# Patient Record
Sex: Male | Born: 1965 | Race: White | Hispanic: No | Marital: Married | Smoking: Never smoker
Health system: Southern US, Community
[De-identification: ages and names within clinical notes are randomized; demographics above are authoritative.]

## PROBLEM LIST (undated history)

## (undated) DIAGNOSIS — Z8614 Personal history of Methicillin resistant Staphylococcus aureus infection: Secondary | ICD-10-CM

## (undated) DIAGNOSIS — Z9889 Other specified postprocedural states: Secondary | ICD-10-CM

## (undated) DIAGNOSIS — J189 Pneumonia, unspecified organism: Secondary | ICD-10-CM

## (undated) DIAGNOSIS — R112 Nausea with vomiting, unspecified: Secondary | ICD-10-CM

## (undated) DIAGNOSIS — Z87442 Personal history of urinary calculi: Secondary | ICD-10-CM

## (undated) HISTORY — PX: NASAL SEPTUM SURGERY: SHX37

## (undated) HISTORY — PX: HERNIA REPAIR: SHX51

## (undated) HISTORY — PX: HAND SURGERY: SHX662

## (undated) HISTORY — PX: KNEE SURGERY: SHX244

## (undated) HISTORY — PX: VASECTOMY: SHX75

---
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
## 2005-05-19 ENCOUNTER — Emergency Department (HOSPITAL_COMMUNITY): Admission: EM | Admit: 2005-05-19 | Discharge: 2005-05-19 | Payer: Self-pay | Admitting: Family Medicine

## 2010-09-20 ENCOUNTER — Ambulatory Visit
Admission: RE | Admit: 2010-09-20 | Discharge: 2010-09-20 | Payer: Self-pay | Source: Home / Self Care | Attending: Orthopedic Surgery | Admitting: Orthopedic Surgery

## 2010-12-16 LAB — POCT HEMOGLOBIN-HEMACUE: Hemoglobin: 16.4 g/dL (ref 13.0–17.0)

## 2013-08-24 ENCOUNTER — Other Ambulatory Visit: Payer: Self-pay | Admitting: Orthopedic Surgery

## 2013-08-26 ENCOUNTER — Encounter (HOSPITAL_COMMUNITY): Payer: Self-pay | Admitting: Pharmacy Technician

## 2013-08-29 ENCOUNTER — Encounter (HOSPITAL_COMMUNITY): Payer: Self-pay

## 2013-08-29 ENCOUNTER — Encounter (HOSPITAL_COMMUNITY)
Admission: RE | Admit: 2013-08-29 | Discharge: 2013-08-29 | Disposition: A | Discharge status: Home / Self Care | Payer: BC Managed Care – PPO | Source: Ambulatory Visit | Attending: Orthopedic Surgery | Admitting: Orthopedic Surgery

## 2013-08-29 ENCOUNTER — Ambulatory Visit (HOSPITAL_COMMUNITY)
Admission: RE | Admit: 2013-08-29 | Discharge: 2013-08-29 | Disposition: A | Discharge status: Home / Self Care | Payer: BC Managed Care – PPO | Source: Ambulatory Visit | Attending: Surgical | Admitting: Surgical

## 2013-08-29 DIAGNOSIS — R29898 Other symptoms and signs involving the musculoskeletal system: Secondary | ICD-10-CM | POA: Insufficient documentation

## 2013-08-29 DIAGNOSIS — Z01812 Encounter for preprocedural laboratory examination: Secondary | ICD-10-CM | POA: Insufficient documentation

## 2013-08-29 DIAGNOSIS — Z01810 Encounter for preprocedural cardiovascular examination: Secondary | ICD-10-CM | POA: Insufficient documentation

## 2013-08-29 DIAGNOSIS — Z8614 Personal history of Methicillin resistant Staphylococcus aureus infection: Secondary | ICD-10-CM | POA: Insufficient documentation

## 2013-08-29 DIAGNOSIS — M48062 Spinal stenosis, lumbar region with neurogenic claudication: Secondary | ICD-10-CM | POA: Insufficient documentation

## 2013-08-29 DIAGNOSIS — M5126 Other intervertebral disc displacement, lumbar region: Principal | ICD-10-CM | POA: Insufficient documentation

## 2013-08-29 DIAGNOSIS — M5430 Sciatica, unspecified side: Secondary | ICD-10-CM | POA: Insufficient documentation

## 2013-08-29 DIAGNOSIS — Z01818 Encounter for other preprocedural examination: Secondary | ICD-10-CM | POA: Insufficient documentation

## 2013-08-29 DIAGNOSIS — M47817 Spondylosis without myelopathy or radiculopathy, lumbosacral region: Secondary | ICD-10-CM | POA: Insufficient documentation

## 2013-08-29 HISTORY — DX: Personal history of Methicillin resistant Staphylococcus aureus infection: Z86.14

## 2013-08-29 HISTORY — DX: Pneumonia, unspecified organism: J18.9

## 2013-08-29 HISTORY — DX: Other specified postprocedural states: R11.2

## 2013-08-29 HISTORY — DX: Nausea with vomiting, unspecified: Z98.890

## 2013-08-29 LAB — COMPREHENSIVE METABOLIC PANEL
ALT: 45 U/L (ref 0–53)
AST: 24 U/L (ref 0–37)
Albumin: 4.2 g/dL (ref 3.5–5.2)
Alkaline Phosphatase: 52 U/L (ref 39–117)
BUN: 21 mg/dL (ref 6–23)
CO2: 24 mEq/L (ref 19–32)
Calcium: 9 mg/dL (ref 8.4–10.5)
Chloride: 101 mEq/L (ref 96–112)
Creatinine, Ser: 1.11 mg/dL (ref 0.50–1.35)
GFR calc Af Amer: >90 mL/min (ref >90)
GFR calc non Af Amer: 79 mL/min — ABNORMAL LOW (ref >90)
Glucose, Bld: 99 mg/dL (ref 70–99)
Potassium: 3.8 mEq/L (ref 3.5–5.1)
Sodium: 136 mEq/L (ref 135–145)
Total Bilirubin: 0.6 mg/dL (ref 0.3–1.2)
Total Protein: 6.8 g/dL (ref 6.0–8.3)

## 2013-08-29 LAB — URINALYSIS, ROUTINE W REFLEX MICROSCOPIC
Bilirubin Urine: NEGATIVE
Glucose, UA: NEGATIVE mg/dL
Hgb urine dipstick: NEGATIVE
Ketones, ur: NEGATIVE mg/dL
Leukocytes, UA: NEGATIVE
Nitrite: NEGATIVE
Protein, ur: NEGATIVE mg/dL
Specific Gravity, Urine: 1.022 (ref 1.005–1.030)
Urobilinogen, UA: 0.2 mg/dL (ref 0.0–1.0)
pH: 7 (ref 5.0–8.0)

## 2013-08-29 LAB — CBC
HCT: 45.5 % (ref 39.0–52.0)
RBC: 5 MIL/uL (ref 4.22–5.81)
RDW: 12.1 % (ref 11.5–15.5)
WBC: 7.7 10*3/uL (ref 4.0–10.5)

## 2013-08-29 LAB — PROTIME-INR
INR: 1.08 (ref 0.00–1.49)
Prothrombin Time: 13.8 seconds (ref 11.6–15.2)

## 2013-08-29 LAB — SURGICAL PCR SCREEN: Staphylococcus aureus: POSITIVE — AB

## 2013-08-29 LAB — APTT: aPTT: 29 seconds (ref 24–37)

## 2013-08-29 NOTE — Patient Instructions (Addendum)
22 Willie Davis  08/29/2013   Your procedure is scheduled on:  Tuesday  11/25  Report to [facility] at AM.  12:30 PM  Call this number if you have problems the morning of surgery [phone]   Remember:   Do not eat food  After Midnight TONIGHT.  YOU MAY HAVE CLEAR LIQUIDS TO DRINK FROM MIDNIGHT UNTIL 8:30 AM TOMORROW - LIKE WATER, SODA, COFFEE - JUST NO MILK OR MILK PRODUCTS.  NOTHING TO DRINK AFTER 8:30 AM DAY OF SURGERY.     Take these medicines the morning of surgery with A SIP OF WATER:    Do not wear jewelry, make-up or nail polish.  Do not wear lotions, powders, or perfumes. You may wear deodorant.  Do not shave 48 hours prior to surgery. Men may shave face and neck.  Do not bring valuables to the hospital.  Contacts, dentures or bridgework may not be worn into surgery.  Leave suitcase in the car. After surgery it may be brought to your room.  For patients admitted to the hospital, checkout time is 11:00 AM the day of discharge.   Patients discharged the day of surgery will not be allowed to drive home.  Name and phone number of your driver:  Special Instructions: Shower using CHG 2 nights before surgery and the night before surgery.  If you shower the day of surgery use CHG.  Use special wash - you have one bottle of CHG for all showers.  You should use approximately 1/3 of the bottle for each shower.   Please read over the following fact sheets that you were given: MRSA Information.  Birdie Sons, RN  pre op nurse call if needed [phone]    FAILURE TO FOLLOW THESE INSTRUCTIONS MAY RESULT IN CANCELLATION OF YOUR SURGERY   Patient Signature: ___________________________________________

## 2013-08-30 ENCOUNTER — Encounter (HOSPITAL_COMMUNITY)
Admission: RE | Disposition: A | Discharge status: Home / Self Care | Payer: Self-pay | Source: Ambulatory Visit | Attending: Orthopedic Surgery

## 2013-08-30 ENCOUNTER — Ambulatory Visit (HOSPITAL_COMMUNITY): Payer: BC Managed Care – PPO

## 2013-08-30 ENCOUNTER — Observation Stay (HOSPITAL_COMMUNITY)
Admission: RE | Admit: 2013-08-30 | Discharge: 2013-08-31 | Disposition: A | Discharge status: Home / Self Care | Payer: BC Managed Care – PPO | Source: Ambulatory Visit | Attending: Orthopedic Surgery | Admitting: Orthopedic Surgery

## 2013-08-30 ENCOUNTER — Encounter (HOSPITAL_COMMUNITY): Payer: BC Managed Care – PPO | Admitting: Certified Registered Nurse Anesthetist

## 2013-08-30 ENCOUNTER — Encounter (HOSPITAL_COMMUNITY): Payer: Self-pay | Admitting: *Deleted

## 2013-08-30 ENCOUNTER — Ambulatory Visit (HOSPITAL_COMMUNITY): Payer: BC Managed Care – PPO | Admitting: Certified Registered Nurse Anesthetist

## 2013-08-30 DIAGNOSIS — M48062 Spinal stenosis, lumbar region with neurogenic claudication: Secondary | ICD-10-CM | POA: Diagnosis present

## 2013-08-30 DIAGNOSIS — M5126 Other intervertebral disc displacement, lumbar region: Secondary | ICD-10-CM

## 2013-08-30 HISTORY — PX: HEMI-MICRODISCECTOMY LUMBAR LAMINECTOMY LEVEL 1: SHX5846

## 2013-08-30 LAB — TYPE AND SCREEN
ABO/RH(D): O POS
Antibody Screen: NEGATIVE

## 2013-08-30 LAB — ABO/RH: ABO/RH(D): O POS

## 2013-08-30 SURGERY — HEMI-MICRODISCECTOMY LUMBAR LAMINECTOMY LEVEL 1
Anesthesia: General | Site: Back | Laterality: Left | Wound class: Clean

## 2013-08-30 MED ORDER — FENTANYL CITRATE 0.05 MG/ML IJ SOLN: 25.0000 ug | INTRAMUSCULAR | Status: DC | PRN

## 2013-08-30 MED ORDER — GLYCOPYRROLATE 0.2 MG/ML IJ SOLN
INTRAMUSCULAR | Status: AC
  Filled 2013-08-30: qty 3

## 2013-08-30 MED ORDER — NEOSTIGMINE METHYLSULFATE 1 MG/ML IJ SOLN
INTRAMUSCULAR | Status: AC
  Filled 2013-08-30: qty 10

## 2013-08-30 MED ORDER — MORPHINE SULFATE 2 MG/ML IJ SOLN: 1.0000 mg | INTRAMUSCULAR | Status: DC | PRN

## 2013-08-30 MED ORDER — LACTATED RINGERS IV SOLN: INTRAVENOUS | Status: DC

## 2013-08-30 MED ORDER — DEXAMETHASONE SODIUM PHOSPHATE 10 MG/ML IJ SOLN
INTRAMUSCULAR | Status: AC
  Filled 2013-08-30: qty 1

## 2013-08-30 MED ORDER — POLYETHYLENE GLYCOL 3350 17 G PO PACK: 17.0000 g | PACK | Freq: Every day | ORAL | Status: DC | PRN

## 2013-08-30 MED ORDER — BUPIVACAINE-EPINEPHRINE PF 0.5-1:200000 % IJ SOLN
INTRAMUSCULAR | Status: AC
  Filled 2013-08-30: qty 30

## 2013-08-30 MED ORDER — PROMETHAZINE HCL 25 MG/ML IJ SOLN
6.2500 mg | INTRAMUSCULAR | Status: DC | PRN
  Administered 2013-08-30: 6.25 mg via INTRAVENOUS

## 2013-08-30 MED ORDER — LACTATED RINGERS IV SOLN
INTRAVENOUS | Status: DC
  Administered 2013-08-30: 1000 mL via INTRAVENOUS

## 2013-08-30 MED ORDER — ONDANSETRON HCL 4 MG/2ML IJ SOLN
INTRAMUSCULAR | Status: AC
  Filled 2013-08-30: qty 2

## 2013-08-30 MED ORDER — MIDAZOLAM HCL 2 MG/2ML IJ SOLN
INTRAMUSCULAR | Status: AC
  Filled 2013-08-30: qty 2

## 2013-08-30 MED ORDER — THROMBIN 5000 UNITS EX SOLR
CUTANEOUS | Status: AC
  Filled 2013-08-30: qty 10000

## 2013-08-30 MED ORDER — METHOCARBAMOL 500 MG PO TABS: 500.0000 mg | ORAL_TABLET | Freq: Four times a day (QID) | ORAL | Status: DC | PRN

## 2013-08-30 MED ORDER — BUPIVACAINE HCL (PF) 0.5 % IJ SOLN
INTRAMUSCULAR | Status: AC
  Filled 2013-08-30: qty 30

## 2013-08-30 MED ORDER — DEXAMETHASONE SODIUM PHOSPHATE 10 MG/ML IJ SOLN
INTRAMUSCULAR | Status: DC | PRN
  Administered 2013-08-30: 10 mg via INTRAVENOUS

## 2013-08-30 MED ORDER — PROPOFOL 10 MG/ML IV BOLUS
INTRAVENOUS | Status: DC | PRN
  Administered 2013-08-30: 150 mg via INTRAVENOUS

## 2013-08-30 MED ORDER — PROPOFOL 10 MG/ML IV BOLUS
INTRAVENOUS | Status: AC
  Filled 2013-08-30: qty 20

## 2013-08-30 MED ORDER — HYDROCODONE-ACETAMINOPHEN 5-325 MG PO TABS: 1.0000 | ORAL_TABLET | ORAL | Status: DC | PRN

## 2013-08-30 MED ORDER — FENTANYL CITRATE 0.05 MG/ML IJ SOLN
INTRAMUSCULAR | Status: AC
  Filled 2013-08-30: qty 5

## 2013-08-30 MED ORDER — THROMBIN 5000 UNITS EX SOLR
CUTANEOUS | Status: DC | PRN
  Administered 2013-08-30: 5000 [IU] via TOPICAL

## 2013-08-30 MED ORDER — MEPERIDINE HCL 50 MG/ML IJ SOLN: 6.2500 mg | INTRAMUSCULAR | Status: DC | PRN

## 2013-08-30 MED ORDER — SODIUM CHLORIDE 0.9 % IJ SOLN
INTRAMUSCULAR | Status: AC
  Filled 2013-08-30: qty 10

## 2013-08-30 MED ORDER — EPHEDRINE SULFATE 50 MG/ML IJ SOLN
INTRAMUSCULAR | Status: DC | PRN
  Administered 2013-08-30 (×2): 10 mg via INTRAVENOUS

## 2013-08-30 MED ORDER — ROCURONIUM BROMIDE 100 MG/10ML IV SOLN
INTRAVENOUS | Status: AC
  Filled 2013-08-30: qty 1

## 2013-08-30 MED ORDER — ONDANSETRON HCL 4 MG/2ML IJ SOLN: 4.0000 mg | INTRAMUSCULAR | Status: DC | PRN

## 2013-08-30 MED ORDER — SUCCINYLCHOLINE CHLORIDE 20 MG/ML IJ SOLN
INTRAMUSCULAR | Status: DC | PRN
  Administered 2013-08-30: 100 mg via INTRAVENOUS

## 2013-08-30 MED ORDER — CEFAZOLIN SODIUM 1-5 GM-% IV SOLN
1.0000 g | Freq: Three times a day (TID) | INTRAVENOUS | Status: DC
  Administered 2013-08-30 – 2013-08-31 (×2): 1 g via INTRAVENOUS
  Filled 2013-08-30 (×3): qty 50

## 2013-08-30 MED ORDER — ONDANSETRON HCL 4 MG/2ML IJ SOLN
INTRAMUSCULAR | Status: DC | PRN
  Administered 2013-08-30: 4 mg via INTRAVENOUS

## 2013-08-30 MED ORDER — CEFAZOLIN SODIUM-DEXTROSE 2-3 GM-% IV SOLR
INTRAVENOUS | Status: AC
  Filled 2013-08-30: qty 50

## 2013-08-30 MED ORDER — PROMETHAZINE HCL 25 MG/ML IJ SOLN
INTRAMUSCULAR | Status: AC
  Filled 2013-08-30: qty 1

## 2013-08-30 MED ORDER — ROCURONIUM BROMIDE 100 MG/10ML IV SOLN
INTRAVENOUS | Status: DC | PRN
  Administered 2013-08-30: 20 mg via INTRAVENOUS
  Administered 2013-08-30: 10 mg via INTRAVENOUS
  Administered 2013-08-30: 40 mg via INTRAVENOUS

## 2013-08-30 MED ORDER — PHENOL 1.4 % MT LIQD: 1.0000 | OROMUCOSAL | Status: DC | PRN

## 2013-08-30 MED ORDER — BUPIVACAINE LIPOSOME 1.3 % IJ SUSP
20.0000 mL | Freq: Once | INTRAMUSCULAR | Status: AC
  Administered 2013-08-30: 20 mL
  Filled 2013-08-30: qty 20

## 2013-08-30 MED ORDER — MIDAZOLAM HCL 5 MG/5ML IJ SOLN
INTRAMUSCULAR | Status: DC | PRN
  Administered 2013-08-30: 2 mg via INTRAVENOUS

## 2013-08-30 MED ORDER — GLYCOPYRROLATE 0.2 MG/ML IJ SOLN
INTRAMUSCULAR | Status: DC | PRN
  Administered 2013-08-30: 0.6 mg via INTRAVENOUS

## 2013-08-30 MED ORDER — MENTHOL 3 MG MT LOZG: 1.0000 | LOZENGE | OROMUCOSAL | Status: DC | PRN

## 2013-08-30 MED ORDER — OXYCODONE-ACETAMINOPHEN 5-325 MG PO TABS: 1.0000 | ORAL_TABLET | ORAL | Status: DC | PRN

## 2013-08-30 MED ORDER — CEFAZOLIN SODIUM-DEXTROSE 2-3 GM-% IV SOLR
2.0000 g | INTRAVENOUS | Status: AC
  Administered 2013-08-30: 2 g via INTRAVENOUS

## 2013-08-30 MED ORDER — LACTATED RINGERS IV SOLN
INTRAVENOUS | Status: DC
  Administered 2013-08-30 – 2013-08-31 (×2): via INTRAVENOUS

## 2013-08-30 MED ORDER — ACETAMINOPHEN 325 MG PO TABS: 650.0000 mg | ORAL_TABLET | ORAL | Status: DC | PRN

## 2013-08-30 MED ORDER — FENTANYL CITRATE 0.05 MG/ML IJ SOLN
INTRAMUSCULAR | Status: DC | PRN
  Administered 2013-08-30 (×5): 50 ug via INTRAVENOUS

## 2013-08-30 MED ORDER — MUPIROCIN 2 % EX OINT
TOPICAL_OINTMENT | Freq: Two times a day (BID) | CUTANEOUS | Status: DC
  Administered 2013-08-30: 13:00:00 via NASAL
  Filled 2013-08-30 (×2): qty 22

## 2013-08-30 MED ORDER — NEOSTIGMINE METHYLSULFATE 1 MG/ML IJ SOLN
INTRAMUSCULAR | Status: DC | PRN
  Administered 2013-08-30: 5 mg via INTRAVENOUS

## 2013-08-30 MED ORDER — METHOCARBAMOL 100 MG/ML IJ SOLN
500.0000 mg | Freq: Four times a day (QID) | INTRAMUSCULAR | Status: DC | PRN
  Administered 2013-08-30: 500 mg via INTRAVENOUS
  Filled 2013-08-30: qty 5

## 2013-08-30 MED ORDER — LIDOCAINE HCL (CARDIAC) 20 MG/ML IV SOLN
INTRAVENOUS | Status: DC | PRN
  Administered 2013-08-30: 100 mg via INTRAVENOUS

## 2013-08-30 MED ORDER — LIDOCAINE HCL (CARDIAC) 20 MG/ML IV SOLN
INTRAVENOUS | Status: AC
  Filled 2013-08-30: qty 5

## 2013-08-30 MED ORDER — BACITRACIN-NEOMYCIN-POLYMYXIN 400-5-5000 EX OINT
TOPICAL_OINTMENT | CUTANEOUS | Status: AC
  Filled 2013-08-30: qty 1

## 2013-08-30 MED ORDER — SUCCINYLCHOLINE CHLORIDE 20 MG/ML IJ SOLN
INTRAMUSCULAR | Status: AC
  Filled 2013-08-30: qty 1

## 2013-08-30 MED ORDER — BISACODYL 10 MG RE SUPP: 10.0000 mg | Freq: Every day | RECTAL | Status: DC | PRN

## 2013-08-30 MED ORDER — SODIUM CHLORIDE 0.9 % IR SOLN
Status: DC | PRN
  Administered 2013-08-30: 16:00:00

## 2013-08-30 MED ORDER — ACETAMINOPHEN 650 MG RE SUPP: 650.0000 mg | RECTAL | Status: DC | PRN

## 2013-08-30 MED ORDER — EPHEDRINE SULFATE 50 MG/ML IJ SOLN
INTRAMUSCULAR | Status: AC
  Filled 2013-08-30: qty 1

## 2013-08-30 MED ORDER — BUPIVACAINE HCL 0.5 % IJ SOLN
INTRAMUSCULAR | Status: DC | PRN
  Administered 2013-08-30: 20 mL

## 2013-08-30 SURGICAL SUPPLY — 44 items
APL SKNCLS STERI-STRIP NONHPOA (GAUZE/BANDAGES/DRESSINGS) ×1
BAG SPEC THK2 15X12 ZIP CLS (MISCELLANEOUS) ×1
BAG ZIPLOCK 12X15 (MISCELLANEOUS) ×2 IMPLANT
BENZOIN TINCTURE PRP APPL 2/3 (GAUZE/BANDAGES/DRESSINGS) ×2 IMPLANT
CLEANER TIP ELECTROSURG 2X2 (MISCELLANEOUS) ×2 IMPLANT
CONT SPECI 4OZ STER CLIK (MISCELLANEOUS) ×1 IMPLANT
DRAIN PENROSE 18X1/4 LTX STRL (WOUND CARE) IMPLANT
DRAPE MICROSCOPE LEICA (MISCELLANEOUS) ×2 IMPLANT
DRAPE POUCH INSTRU U-SHP 10X18 (DRAPES) ×2 IMPLANT
DRAPE SURG 17X11 SM STRL (DRAPES) ×2 IMPLANT
DRSG ADAPTIC 3X8 NADH LF (GAUZE/BANDAGES/DRESSINGS) ×2 IMPLANT
DRSG PAD ABDOMINAL 8X10 ST (GAUZE/BANDAGES/DRESSINGS) ×2 IMPLANT
DURAPREP 26ML APPLICATOR (WOUND CARE) ×2 IMPLANT
ELECT REM PT RETURN 9FT ADLT (ELECTROSURGICAL) ×2
ELECTRODE REM PT RTRN 9FT ADLT (ELECTROSURGICAL) ×1 IMPLANT
GLOVE BIOGEL PI IND STRL 8 (GLOVE) ×1 IMPLANT
GLOVE BIOGEL PI INDICATOR 8 (GLOVE) ×1
GLOVE ECLIPSE 8.0 STRL XLNG CF (GLOVE) ×5 IMPLANT
GOWN PREVENTION PLUS LG XLONG (DISPOSABLE) ×6 IMPLANT
GOWN STRL REIN XL XLG (GOWN DISPOSABLE) ×5 IMPLANT
KIT BASIN OR (CUSTOM PROCEDURE TRAY) ×2 IMPLANT
KIT POSITIONING SURG ANDREWS (MISCELLANEOUS) ×2 IMPLANT
MANIFOLD NEPTUNE II (INSTRUMENTS) ×2 IMPLANT
NDL SPNL 18GX3.5 QUINCKE PK (NEEDLE) ×2 IMPLANT
NEEDLE SPNL 18GX3.5 QUINCKE PK (NEEDLE) ×4 IMPLANT
NS IRRIG 1000ML POUR BTL (IV SOLUTION) ×2 IMPLANT
PATTIES SURGICAL .5 X.5 (GAUZE/BANDAGES/DRESSINGS) IMPLANT
PATTIES SURGICAL .75X.75 (GAUZE/BANDAGES/DRESSINGS) IMPLANT
PATTIES SURGICAL 1X1 (DISPOSABLE) IMPLANT
PIN SAFETY NICK PLATE  2 MED (MISCELLANEOUS)
PIN SAFETY NICK PLATE 2 MED (MISCELLANEOUS) IMPLANT
POSITIONER SURGICAL ARM (MISCELLANEOUS) ×1 IMPLANT
SPONGE GAUZE 4X4 12PLY (GAUZE/BANDAGES/DRESSINGS) ×1 IMPLANT
SPONGE LAP 4X18 X RAY DECT (DISPOSABLE) ×2 IMPLANT
SPONGE SURGIFOAM ABS GEL 100 (HEMOSTASIS) ×2 IMPLANT
STAPLER VISISTAT 35W (STAPLE) ×1 IMPLANT
SUT VIC AB 0 CT1 27 (SUTURE) ×2
SUT VIC AB 0 CT1 27XBRD ANTBC (SUTURE) ×1 IMPLANT
SUT VIC AB 1 CT1 27 (SUTURE) ×8
SUT VIC AB 1 CT1 27XBRD ANTBC (SUTURE) ×4 IMPLANT
TAPE CLOTH SURG 4X10 WHT LF (GAUZE/BANDAGES/DRESSINGS) ×1 IMPLANT
TOWEL OR 17X26 10 PK STRL BLUE (TOWEL DISPOSABLE) ×5 IMPLANT
TRAY LAMINECTOMY (CUSTOM PROCEDURE TRAY) ×2 IMPLANT
WATER STERILE IRR 1500ML POUR (IV SOLUTION) ×2 IMPLANT

## 2013-08-30 NOTE — Brief Op Note (Signed)
08/30/2013  4:48 PM  PATIENT:  Willie Davis  47 y.o. 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  PROCEDURE:  Procedure(s): HEMI-laminectomy MICRODISCECTOMY L5-S1 LEFT (LEVEL 1) (Left)for Foraminal Disc and Decompression for Spinal Stenosis.Foraminotomies for Two Nerve Roots,L-5 and S-1 on the left.  SURGEON:  Surgeon(s) and Role:    * Jacki Cones, MD - Primary    * Drucilla Schmidt, MD - Assisting     ASSISTANTS:James Aplington MD   ANESTHESIA:   general  EBL:  Total I/O In: 1000 [I.V.:1000] Out: 50 [Blood:50]  BLOOD ADMINISTERED:none  DRAINS: none   LOCAL MEDICATIONS USED:  MARCAINE 20cc of 0.50% with Epinephrine,at beginning of case and 20cc of Exparel at end of case.    SPECIMEN:  Source of Specimen:  L-5-S-1  DISPOSITION OF SPECIMEN:  PATHOLOGY  COUNTS:  YES  TOURNIQUET:  * No tourniquets in log *  DICTATION: .Other Dictation: Dictation Number [id number]  PLAN OF CARE: Admit for overnight observation  PATIENT DISPOSITION:  PACU - hemodynamically stable.   Delay start of Pharmacological VTE agent (>24hrs) due to surgical blood loss or risk of bleeding: yes

## 2013-08-30 NOTE — Interval H&P Note (Signed)
History and Physical Interval Note:  08/30/2013 1:53 PM  Willie Davis  has presented today for surgery, with the diagnosis of herniated disc  The various methods of treatment have been discussed with the patient and family. After consideration of risks, benefits and other options for treatment, the patient has consented to  Procedure(s): HEMI-laminectomy MICRODISCECTOMY L5-S1 LEFT (LEVEL 1) (Left) as a surgical intervention .  The patient's history has been reviewed, patient examined, no change in status, stable for surgery.  I have reviewed the patient's chart and labs.  Questions were answered to the patient's satisfaction.     Jakwon Gayton A

## 2013-08-30 NOTE — Anesthesia Postprocedure Evaluation (Signed)
Anesthesia Post Note  Patient: Willie Davis  Procedure(s) Performed: Procedure(s) (LRB): HEMI-laminectomy MICRODISCECTOMY L5-S1 LEFT (LEVEL 1) (Left)  Anesthesia type: General  Patient location: PACU  Post pain: Pain level controlled  Post assessment: Post-op Vital signs reviewed  Last Vitals: BP 124/77  Pulse 82  Temp(Src) 36.7 C (Oral)  Resp 16  Ht 6\' 4"  (1.93 m)  Wt 250 lb (113.399 kg)  BMI 30.44 kg/m2  SpO2 98%  Post vital signs: Reviewed  Level of consciousness: sedated  Complications: No apparent anesthesia complications

## 2013-08-30 NOTE — H&P (Signed)
Willie Davis is an 47 y.o. male.   Chief Complaint: back pain HPI: Willie Davis presented to Dr. Jeannetta Ellis office with the chief complaint of back pain. He has an acute left sciatica and a partial weakness of his foot on the left. He does not recall any injury or change in activity.The pain radiates to the left buttock, left posterior thigh, left posterior lower leg and left foot. The patient describes the pain as sharp, burning, tingling and throbbing. He started experiencing these symptoms about a week ago. MRI showed that he has a large foraminal disc at L5-S1 on the left.   Past Medical History  Diagnosis Date  . Pneumonia     age 36  . Hx MRSA infection 15 years ago  . PONV (postoperative nausea and vomiting)     during nasal surgery    Past Surgical History  Procedure Laterality Date  . Nasal septum surgery  at least 12 years ago    x2  . Cholecystectomy  1986  . Appendectomy  1986  . Knee surgery Bilateral   . Hernia repair      double  . Vasectomy      Social History:  reports that he has never smoked. He has never used smokeless tobacco. He reports that he drinks alcohol. He reports that he does not use illicit drugs.  Allergies:  Allergies  Allergen Reactions  . Allegra-D [Fexofenadine-Pseudoephed Er] Hives  . Hydromorphone Itching    Face and eyes itched    Current outpatient prescriptions: oxycodone-acetaminophen (PERCOCET) 2.5-325 MG per tablet, Take 1 tablet by mouth every 4 (four) hours as needed for pain., Disp: , Rfl:   Results for orders placed during the hospital encounter of 08/29/13 (from the past 48 hour(s))  SURGICAL PCR SCREEN     Status: Abnormal   Collection Time    08/29/13 11:54 AM      Result Value Range   MRSA, PCR NEGATIVE  NEGATIVE   Staphylococcus aureus POSITIVE (*) NEGATIVE   Comment:            The Xpert SA Assay (FDA     approved for NASAL specimens     in patients over 89 years of age),     is one component of     a comprehensive  surveillance     program.  Test performance has     been validated by [employer] for patients greater     than or equal to 68 year old.     It is not intended     to diagnose infection nor to     guide or monitor treatment.  URINALYSIS, ROUTINE W REFLEX MICROSCOPIC     Status: None   Collection Time    08/29/13 11:55 AM      Result Value Range   Color, Urine YELLOW  YELLOW   APPearance CLEAR  CLEAR   Specific Gravity, Urine 1.022  1.005 - 1.030   pH 7.0  5.0 - 8.0   Glucose, UA NEGATIVE  NEGATIVE mg/dL   Hgb urine dipstick NEGATIVE  NEGATIVE   Bilirubin Urine NEGATIVE  NEGATIVE   Ketones, ur NEGATIVE  NEGATIVE mg/dL   Protein, ur NEGATIVE  NEGATIVE mg/dL   Urobilinogen, UA 0.2  0.0 - 1.0 mg/dL   Nitrite NEGATIVE  NEGATIVE   Leukocytes, UA NEGATIVE  NEGATIVE   Comment: MICROSCOPIC NOT DONE ON URINES WITH NEGATIVE PROTEIN, BLOOD, LEUKOCYTES, NITRITE, OR GLUCOSE <1000 mg/dL.  CBC     Status: Abnormal   Collection Time    08/29/13 12:05 PM      Result Value Range   WBC 7.7  4.0 - 10.5 K/uL   RBC 5.00  4.22 - 5.81 MIL/uL   Hemoglobin 16.7  13.0 - 17.0 g/dL   HCT 09.8  11.9 - 14.7 %   MCV 91.0  78.0 - 100.0 fL   MCH 33.4  26.0 - 34.0 pg   MCHC 36.7 (*) 30.0 - 36.0 g/dL   RDW 82.9  56.2 - 13.0 %   Platelets 235  150 - 400 K/uL  APTT     Status: None   Collection Time    08/29/13 12:05 PM      Result Value Range   aPTT 29  24 - 37 seconds  COMPREHENSIVE METABOLIC PANEL     Status: Abnormal   Collection Time    08/29/13 12:05 PM      Result Value Range   Sodium 136  135 - 145 mEq/L   Potassium 3.8  3.5 - 5.1 mEq/L   Chloride 101  96 - 112 mEq/L   CO2 24  19 - 32 mEq/L   Glucose, Bld 99  70 - 99 mg/dL   BUN 21  6 - 23 mg/dL   Creatinine, Ser 8.65  0.50 - 1.35 mg/dL   Calcium 9.0  8.4 - 78.4 mg/dL   Total Protein 6.8  6.0 - 8.3 g/dL   Albumin 4.2  3.5 - 5.2 g/dL   AST 24  0 - 37 U/L   ALT 45  0 - 53 U/L   Alkaline Phosphatase 52  39 - 117 U/L   Total Bilirubin  0.6  0.3 - 1.2 mg/dL   GFR calc non Af Amer 79 (*) >90 mL/min   GFR calc Af Amer >90  >90 mL/min   Comment: (NOTE)     The eGFR has been calculated using the CKD EPI equation.     This calculation has not been validated in all clinical situations.     eGFR's persistently <90 mL/min signify possible Chronic Kidney     Disease.  PROTIME-INR     Status: None   Collection Time    08/29/13 12:05 PM      Result Value Range   Prothrombin Time 13.8  11.6 - 15.2 seconds   INR 1.08  0.00 - 1.49   Dg Chest 2 View  08/29/2013   CLINICAL DATA:  Preop evaluation for lumbar Sir  EXAM: CHEST  2 VIEW  COMPARISON:  None.  FINDINGS: Cardiac shadow is within normal limits. The lungs are well aerated bilaterally. No focal infiltrate or sizable effusion is seen. No bony abnormality is noted.  IMPRESSION: No acute abnormality seen.   Electronically Signed   By: Alcide Clever M.D.   On: 08/29/2013 15:24   Dg Lumbar Spine 2-3 Views  08/29/2013   CLINICAL DATA:  Severe sciatica and low back pain  EXAM: LUMBAR SPINE - 2-3 VIEW  COMPARISON:  None.  FINDINGS: There is no evidence of lumbar spine fracture. There is decreased intervertebral disc space at L5-S1. Minimal anterior osteophytosis is identified at L4 and L5. Several small radiopaque markers are projected in the pelvis.  IMPRESSION: Mild degenerative joint changes at L5-S1. No acute fracture or dislocation.   Electronically Signed   By: Sherian Rein M.D.   On: 08/29/2013 15:48    Review of Systems  Constitutional: Negative.   HENT:  Negative.   Eyes: Negative.   Respiratory: Negative.   Cardiovascular: Negative.   Gastrointestinal: Negative.   Genitourinary: Negative.   Musculoskeletal: Positive for back pain and joint pain. Negative for falls, myalgias and neck pain.  Skin: Negative.   Neurological: Positive for tingling. Negative for dizziness, tremors, sensory change, speech change, focal weakness, seizures and loss of consciousness.   Endo/Heme/Allergies: Negative.   Psychiatric/Behavioral: Negative.    Physical Exam  Constitutional: He is oriented to person, place, and time. He appears well-developed and well-nourished. No distress.  HENT:  Head: Normocephalic and atraumatic.  Right Ear: External ear normal.  Left Ear: External ear normal.  Nose: Nose normal.  Mouth/Throat: Oropharynx is clear and moist.  Eyes: Conjunctivae and EOM are normal.  Neck: Normal range of motion. Neck supple.  Cardiovascular: Normal rate, regular rhythm, normal heart sounds and intact distal pulses.   No murmur heard. Respiratory: Effort normal and breath sounds normal. No respiratory distress. He has no wheezes.  GI: Soft. Bowel sounds are normal. He exhibits no distension. There is no tenderness.  Musculoskeletal:       Right hip: Normal.       Left hip: Normal.       Right knee: Normal.       Left knee: Normal.       Lumbar back: He exhibits decreased range of motion, tenderness, pain and spasm.       Right lower leg: He exhibits no tenderness and no swelling.       Left lower leg: He exhibits no tenderness and no swelling.  Positive SLR on the left  Neurological: He is alert and oriented to person, place, and time. He has normal reflexes. No sensory deficit.  Skin: No rash noted. He is not diaphoretic. No erythema.  Psychiatric: He has a normal mood and affect. His behavior is normal.     Assessment/Plan He has a disc herniation at L5-S1 on the left. He needs a lumbar hemilaminectomy and microdiscectomy at L5-S1 on the left. The possible complications of spinal surgery number one could be infection, which is extremely rare. We do use antibiotics prior to the surgery and during surgery and after surgery. Number two is always a slight degree of probability that you could develop a blood clot in your leg after any type of surgery and we try our best to prevent that with aspirin post op when it is safe to begin. The third is a dural  leak. That is the spinal fluid leak that could occur. At certain rare times the bone or the disc could literally stick to the dura which is the lining which contains the spinal fluid and we could develop a small tear in that lining which we then patch up. That is an extremely rare complication. The last and final complication is a recurrent disc rupture. That means that you could rupture another small piece of disc later on down the road and there is about a 2% chance of that.  Shadow Schedler LAUREN 08/30/2013, 8:05 AM

## 2013-08-30 NOTE — Anesthesia Preprocedure Evaluation (Signed)
Anesthesia Evaluation  Patient identified by MRN, date of birth, ID band Patient awake    Reviewed: Allergy & Precautions, H&P , NPO status , Patient's Chart, lab work & pertinent test results  History of Anesthesia Complications (+) PONV and history of anesthetic complications  Airway       Dental  (+) Dental Advisory Given   Pulmonary pneumonia -,          Cardiovascular negative cardio ROS      Neuro/Psych negative neurological ROS  negative psych ROS   GI/Hepatic negative GI ROS, Neg liver ROS,   Endo/Other  negative endocrine ROS  Renal/GU negative Renal ROS     Musculoskeletal negative musculoskeletal ROS (+)   Abdominal   Peds  Hematology negative hematology ROS (+)   Anesthesia Other Findings   Reproductive/Obstetrics negative OB ROS                           Anesthesia Physical Anesthesia Plan  ASA: II  Anesthesia Plan: General   Post-op Pain Management:    Induction: Intravenous  Airway Management Planned: Oral ETT  Additional Equipment:   Intra-op Plan:   Post-operative Plan: Extubation in OR  Informed Consent: I have reviewed the patients History and Physical, chart, labs and discussed the procedure including the risks, benefits and alternatives for the proposed anesthesia with the patient or authorized representative who has indicated his/her understanding and acceptance.   Dental advisory given  Plan Discussed with: CRNA  Anesthesia Plan Comments:         Anesthesia Quick Evaluation

## 2013-08-30 NOTE — Preoperative (Signed)
Beta Blockers   Reason not to administer Beta Blockers:Not Applicable 

## 2013-08-30 NOTE — Plan of Care (Signed)
Problem: Consults Goal: Diagnosis - Spinal Surgery Lumbar Laminectomy (Complex)     

## 2013-08-30 NOTE — Plan of Care (Signed)
Problem: Consults Goal: Diagnosis - Spinal Surgery Lumbar Laminectomy (Complex) L5-S1

## 2013-08-30 NOTE — Transfer of Care (Signed)
Immediate Anesthesia Transfer of Care Note  Patient: Willie Davis  Procedure(s) Performed: Procedure(s) (LRB): HEMI-laminectomy MICRODISCECTOMY L5-S1 LEFT (LEVEL 1) (Left)  Patient Location: PACU  Anesthesia Type: General  Level of Consciousness: sedated, patient cooperative and responds to stimulation  Airway & Oxygen Therapy: Patient Spontanous Breathing and Patient connected to face mask oxgen  Post-op Assessment: Report given to PACU RN and Post -op Vital signs reviewed and stable  Post vital signs: Reviewed and stable  Complications: No apparent anesthesia complications

## 2013-08-31 ENCOUNTER — Encounter (HOSPITAL_COMMUNITY): Payer: Self-pay | Admitting: Orthopedic Surgery

## 2013-08-31 MED ORDER — METHOCARBAMOL 500 MG PO TABS: 500.0000 mg | ORAL_TABLET | Freq: Four times a day (QID) | ORAL | Status: DC | PRN

## 2013-08-31 MED ORDER — OXYCODONE HCL 5 MG PO TABS: 5.0000 mg | ORAL_TABLET | ORAL | Status: DC | PRN

## 2013-08-31 NOTE — Evaluation (Addendum)
Occupational Therapy Evaluation Patient Details Name: Willie Davis MRN: [id number] DOB: 22-Jan-1966 Today's Date: 08/31/2013 Time: 1478-2956 OT Time Calculation (min): 16 min  OT Assessment / Plan / Recommendation History of present illness : Zaydrian presented to Dr. Jeannetta Ellis office with the chief complaint of back pain. He has an acute left sciatica and a partial weakness of his foot on the left. He does not recall any injury or change in activity.The pain radiates to the left buttock, left posterior thigh, left posterior lower leg and left foot. The patient describes the pain as sharp, burning, tingling and throbbing. He started experiencing these symptoms about a week ago. MRI showed that he has a large foraminal disc at L5-S1 on the left.    Clinical Impression   Pt doing well and practiced ADL. Reviewed back precautions and wife present. Reminded pt to go slower with activity to help with following back precautions consistently. Ready for d/c from OT standpoint.    OT Assessment  Patient does not need any further OT services    Follow Up Recommendations  No OT follow up;Supervision - Intermittent    Barriers to Discharge      Equipment Recommendations  None recommended by OT    Recommendations for Other Services    Frequency       Precautions / Restrictions Precautions Precautions: Back Precaution Booklet Issued: Yes (comment) Precaution Comments: stated all 3/3. reviewed all in relation to ADL   Pertinent Vitals/Pain 0/10 pain   ADL  Eating/Feeding: Independent Where Assessed - Eating/Feeding: Chair Grooming: Wash/dry hands;Supervision/safety Where Assessed - Grooming: Unsupported standing Upper Body Bathing: Chest;Right arm;Left arm;Abdomen;Set up;Supervision/safety Where Assessed - Upper Body Bathing: Unsupported sitting Lower Body Bathing: Supervision/safety Where Assessed - Lower Body Bathing: Supported sit to stand Upper Body Dressing: Set  up;Supervision/safety Where Assessed - Upper Body Dressing: Unsupported standing Lower Body Dressing: Supervision/safety Where Assessed - Lower Body Dressing: Supported sit to stand Toilet Transfer: Supervision/safety Acupuncturist: Comfort height toilet;Grab bars Toileting - Architect and Hygiene: Supervision/safety Where Assessed - Engineer, mining and Hygiene: Sit on 3-in-1 or toilet ADL Comments: Demonstrated LHS and reacher. Pt is able to cross legs up to don clothing and has shower seat he can sit on to wash lower legs and feet via crossing legs up also. Wife will be at home with pt and can help PRN. Reviewed back care handout with them. Pt only has a very small ledge to step over shower and demonstrated technique and pt verbalized understanding. Pt needed occasional verbal cues with sit to stand and to sit again for back precautions but did better with practice. Pt states he has the wall close enough on each side of toilet to push up and wife can help if needed also. No vanity next to toilet.    OT Diagnosis:    OT Problem List:   OT Treatment Interventions:     OT Goals(Current goals can be found in the care plan section) Acute Rehab OT Goals Patient Stated Goal: home  Visit Information  Last OT Received On: 08/31/13 Assistance Needed: +1 History of Present Illness: : Hobson presented to Dr. Jeannetta Ellis office with the chief complaint of back pain. He has an acute left sciatica and a partial weakness of his foot on the left. He does not recall any injury or change in activity.The pain radiates to the left buttock, left posterior thigh, left posterior lower leg and left foot. The patient describes the pain as sharp, burning, tingling  and throbbing. He started experiencing these symptoms about a week ago. MRI showed that he has a large foraminal disc at L5-S1 on the left.        Prior Functioning     Home Living Family/patient expects to be  discharged to:: Private residence Living Arrangements: Spouse/significant other;Other relatives;Children Available Help at Discharge: Family Type of Home: House Home Layout: Two level Alternate Level Stairs-Rails: Can reach both;Right;Left Home Equipment: None Prior Function Level of Independence: Independent Comments: x 1 week incapacitated with pain Communication Communication: No difficulties         Vision/Perception     Cognition  Cognition Arousal/Alertness: Awake/alert Behavior During Therapy: WFL for tasks assessed/performed Overall Cognitive Status: Within Functional Limits for tasks assessed    Extremity/Trunk Assessment Upper Extremity Assessment Upper Extremity Assessment: Overall WFL for tasks assessed Lower Extremity Assessment     Mobility  Details for Bed Mobility Assistance: cues for  back precautions. Transfers Transfers: Sit to Stand;Stand to Sit Sit to Stand: 5: Supervision;With upper extremity assist;From bed;From toilet Stand to Sit: 5: Supervision;With upper extremity assist;To bed;To chair/3-in-1 Details for Transfer Assistance: verbal cues for back precautions     Exercise     Balance Balance Balance Assessed: Yes Dynamic Standing Balance Dynamic Standing - Level of Assistance: 5: Stand by assistance   End of Session OT - End of Session Activity Tolerance: Patient tolerated treatment well Patient left: in chair;with call bell/phone within reach;with family/visitor present  GO     Lennox Laity [phone] 08/31/2013, 8:57 AM

## 2013-08-31 NOTE — Evaluation (Signed)
Physical Therapy Evaluation Patient Details Name: Willie Davis MRN: [id number] DOB: 1966-07-03 Today's Date: 08/31/2013 Time: 4098-1191 PT Time Calculation (min): 13 min  PT Assessment / Plan / Recommendation History of Present Illness  : Willie Davis presented to Dr. Jeannetta Ellis office with the chief complaint of back pain. He has an acute left sciatica and a partial weakness of his foot on the left. He does not recall any injury or change in activity.The pain radiates to the left buttock, left posterior thigh, left posterior lower leg and left foot. The patient describes the pain as sharp, burning, tingling and throbbing. He started experiencing these symptoms about a week ago. MRI showed that he has a large foraminal disc at L5-S1 on the left.   Clinical Impression  Pt mobilizing well. No further needs.Pt reports L foot remains numb but did not interfere with ambulation.    PT Assessment  Patent does not need any further PT services    Follow Up Recommendations  No PT follow up    Does the patient have the potential to tolerate intense rehabilitation      Barriers to Discharge        Equipment Recommendations  None recommended by PT    Recommendations for Other Services     Frequency      Precautions / Restrictions Precautions Precautions: Back Precaution Booklet Issued: Yes (comment) Precaution Comments: pt  stated/demonstrated 3/3   Pertinent Vitals/Pain No pain, does have numbness L foot      Mobility  Bed Mobility Bed Mobility: Rolling Left;Left Sidelying to Sit Rolling Left: 7: Independent Left Sidelying to Sit: 6: Modified independent (Device/Increase time);With rails Details for Bed Mobility Assistance: cues for  back precautions. Transfers Transfers: Sit to Stand;Stand to Sit Sit to Stand: 5: Supervision Stand to Sit: 5: Supervision Ambulation/Gait Ambulation/Gait Assistance: 5: Supervision Ambulation Distance (Feet): 400 Feet Ambulation/Gait Assistance  Details: used RW x 50' only Gait Pattern: Step-through pattern Stairs: Yes Stairs Assistance: 6: Modified independent (Device/Increase time) Stair Management Technique: Two rails Number of Stairs: 3    Exercises     PT Diagnosis:    PT Problem List:   PT Treatment Interventions:       PT Goals(Current goals can be found in the care plan section)    Visit Information  Last PT Received On: 08/31/13 Assistance Needed: +1 History of Present Illness: : Willie Davis presented to Dr. Jeannetta Ellis office with the chief complaint of back pain. He has an acute left sciatica and a partial weakness of his foot on the left. He does not recall any injury or change in activity.The pain radiates to the left buttock, left posterior thigh, left posterior lower leg and left foot. The patient describes the pain as sharp, burning, tingling and throbbing. He started experiencing these symptoms about a week ago. MRI showed that he has a large foraminal disc at L5-S1 on the left.        Prior Functioning  Home Living Family/patient expects to be discharged to:: Private residence Living Arrangements: Spouse/significant other;Other relatives;Children Available Help at Discharge: Family Type of Home: House Home Layout: Two level Alternate Level Stairs-Rails: Can reach both;Right;Left Home Equipment: None Prior Function Level of Independence: Independent Comments: x 1 week incapacitated with pain Communication Communication: No difficulties    Cognition  Cognition Arousal/Alertness: Awake/alert Behavior During Therapy: WFL for tasks assessed/performed Overall Cognitive Status: Within Functional Limits for tasks assessed    Extremity/Trunk Assessment Upper Extremity Assessment Upper Extremity Assessment: Defer to OT evaluation  Lower Extremity Assessment Lower Extremity Assessment: LLE deficits/detail LLE Deficits / Details: decreased toe extension, decr. Dorsiflexion, numbness foot [place].   Balance    End  of Session PT - End of Session Activity Tolerance: Patient tolerated treatment well Patient left:  (with OT) Nurse Communication: Mobility status  GP     Rada Hay 08/31/2013, 8:36 AM

## 2013-08-31 NOTE — Progress Notes (Signed)
08/31/13 0836  PT Time Calculation  PT Start Time 0817  PT Stop Time 0830  PT Time Calculation (min) 13 min  PT G-Codes **NOT FOR INPATIENT CLASS**  Functional Assessment Tool Used clinical judgement  Functional Limitation Mobility: Walking and moving around  Mobility: Walking and Moving Around Current Status [phone]) CI  Mobility: Walking and Moving Around Goal Status (B1478) CI  Mobility: Walking and Moving Around Discharge Status (G9562) CI  PT General Charges  $$ ACUTE PT VISIT 1 Procedure  PT Evaluation  $Initial PT Evaluation Tier I 1 Procedure  Blanchard Kelch PT [phone]

## 2013-08-31 NOTE — Progress Notes (Signed)
   Subjective: 1 Day Post-Op Procedure(s) (LRB): HEMI-laminectomy MICRODISCECTOMY L5-S1 LEFT (LEVEL 1) (Left) Patient reports pain as mild.   Patient seen in rounds without Dr.Gioffre Patient is well, and has had no acute complaints or problems. He reports that he slept well last night. Voiding well. No issues overnight. No SOB or chest pain. He is anxious to return to work.  Plan is to go Home after hospital stay.  Objective: Vital signs in last 24 hours: Temp:  [98 F (36.7 C)-98.9 F (37.2 C)] 98.5 F (36.9 C) (11/26 0557) Pulse Rate:  [63-83] 65 (11/26 0557) Resp:  [9-18] 16 (11/26 0557) BP: (108-145)/(62-91) 121/77 mmHg (11/26 0557) SpO2:  [95 %-99 %] 97 % (11/26 0557) Weight:  [113.399 kg (250 lb)] 113.399 kg (250 lb) (11/25 1752)  Intake/Output from previous day:  Intake/Output Summary (Last 24 hours) at 08/31/13 0736 Last data filed at 08/31/13 0553  Gross per 24 hour  Intake   1400 ml  Output   1575 ml  Net   -175 ml     Labs:  Recent Labs  08/29/13 1205  HGB 16.7    Recent Labs  08/29/13 1205  WBC 7.7  RBC 5.00  HCT 45.5  PLT 235    Recent Labs  08/29/13 1205  NA 136  K 3.8  CL 101  CO2 24  BUN 21  CREATININE 1.11  GLUCOSE 99  CALCIUM 9.0    Recent Labs  08/29/13 1205  INR 1.08    EXAM General - Patient is Alert and Oriented Extremity - Neurologically intact Dorsiflexion/Plantar flexion intact Dressing/Incision - clean, dry Motor Function - intact, moving foot and toes well on exam.   Past Medical History  Diagnosis Date  . Pneumonia     age 18  . Hx MRSA infection 15 years ago  . PONV (postoperative nausea and vomiting)     during nasal surgery    Assessment/Plan: 1 Day Post-Op Procedure(s) (LRB): HEMI-laminectomy MICRODISCECTOMY L5-S1 LEFT (LEVEL 1) (Left) Active Problems:   Herniated lumbar intervertebral disc   Spinal stenosis, lumbar region, with neurogenic claudication  Estimated body mass index is 30.44 kg/(m^2)  as calculated from the following:   Height as of this encounter: 6\' 4"  (1.93 m).   Weight as of this encounter: 113.399 kg (250 lb). Advance diet Up with therapy D/C IV fluids Discharge home   DVT Prophylaxis - Aspirin Weight-Bearing as tolerated   He is progressing well. PT this morning and then DC home if doing well. Discussed wound care. Will follow up in 2 weeks. Discussed return to work. He may return to work for sedentary work ONLY in one week if he continues to progress. Absolutely no lifting.   Keiera Strathman LAUREN 08/31/2013, 7:36 AM

## 2013-08-31 NOTE — Progress Notes (Signed)
08/31/13 0856  OT G-codes **NOT FOR INPATIENT CLASS**  Functional Assessment Tool Used clinical judgement  Functional Limitation Self care  Self Care Current Status (J1914) CI  Self Care Goal Status (N8295) CI  Self Care Discharge Status [phone]) CI  G codes Judithann Sauger OTR/L [phone] 08/31/2013

## 2013-08-31 NOTE — Op Note (Signed)
NAMEJACOBUS, COLVIN NO.:  [id number]  MEDICAL RECORD NO.:  [id number]  LOCATION:  1610                         FACILITY:  [facility]  PHYSICIAN:  Georges Lynch. Brett Darko, M.D.DATE OF BIRTH:  [birth year]  DATE OF PROCEDURE:  08/30/2013 DATE OF DISCHARGE:                              OPERATIVE REPORT   SURGEON:  Georges Lynch. Darrelyn Hillock, M.D.  ASSISTANT:  Marlowe Kays, M.D.  PREOPERATIVE DIAGNOSES: 1. Large foraminal herniated lumbar disk, L5-S1 on the left with     severe left leg pain. 2. Severe lateral recess stenosis at L5-S1 on the left.  POSTOPERATIVE DIAGNOSES: 1. Large foraminal herniated lumbar disk, L5-S1 on the left with     severe left leg pain. 2. Severe lateral recess stenosis at L5-S1 on the left.  OPERATION: 1. Hemilaminectomy at L5-S1 on the left. 2. Decompressive lumbar laminectomy for lateral recess stenosis at L5-     S1 on the left. 3. Microdiskectomy at L5-S1 on left for foraminal disk.  LITTLE HISTORY:  This gentleman was in so much pain preop that I had a selective nerve root block performed on him last Friday 3 days before surgery.  PROCEDURE:  Under general anesthesia, routine orthopedic prep and drape in lower back was carried out, the appropriate time-out was first carried out.  I also marked the appropriate left side of his back in the holding area.  He had 2 g of IV Ancef at the beginning of the procedure. Two needles then were placed in the back for localization purposes, x- ray was taken.  Following that, an incision was made over L5-S1 on the left.  We went down separated the muscle from the lamina and spinous process.  We then took another x-ray, so marker was placed to verify the position.  Following that, we then began to carry out our hemilaminectomy in the usual fashion.  We had to go far out laterally to decompress the lateral recess, go up and do a partial facetectomy in order to approach the foraminal disk.  Once the S1 nerve  root was identified and noted the root above, we then placed a D'Errico retractor, gently retracted the dura and S1 root and inserted a spinal needle.  At this particular point, an x-ray was taken to verify root at the L5-S1 interspace.  Following that, the microscope was brought in. We made a cruciate incision in the posterior longitudinal ligament. There was a large amount of disk material that literally ruptured out through the opening.  Following that, I then took a nerve hook.  We then went up subligamentous with a nerve hook and Epstein curettes on multiple passes especially up proximally and laterally and out into the foramen.  We continued to do that slowly by use of the microscope to make sure we removed all the disk materials.  There were large fragments of disk that were subligamentous and up and out laterally toward the foramen.  We then went down into the disk space and completed diskectomy.  We went distally, went medially and laterally until we had no further disk material.  We were able to easily pass the Epstein curettes up under the posterior longitudinal ligament.  We did foraminotomies for the S1 root and L5 root above as well.  We then thoroughly irrigated the area out.  We were able to easily pass a hockey- stick distally and proximally and laterally.  The roots now were wide open, and freely movable.  We cauterized lateral recess veins along the way.  We then loosely applied some thrombin-soaked Gelfoam and closed the wound in layers in usual fashion.  I left a small distal deep and proximal part of the wound open for drainage purposes.  After closing the muscle and fascia, I injected a mixture of 20 mL of Exparel.  At the beginning of procedure, I injected 20 mL of 0.5% Marcaine, epinephrine into the soft tissue to prevent any bleeding.  The subcu then was closed with #1 Vicryl, skin with metal staples.  Sterile Neosporin dressing was applied.  The patient left the  operating room in satisfactory condition.          ______________________________ Georges Lynch Darrelyn Hillock, M.D.     RAG/MEDQ  D:  08/30/2013  T:  08/31/2013  Job:  [id number]

## 2013-09-05 NOTE — Discharge Summary (Signed)
Physician Discharge Summary   Patient ID: MARCELO ICKES MRN: [id number] DOB/AGE: 1966/03/04 47 y.o.  Admit date: 08/30/2013 Discharge date: 08/31/2013  Primary Diagnosis: Lumbar disc herniation    Admission Diagnoses:  Past Medical History  Diagnosis Date  . Pneumonia     age 28  . Hx MRSA infection 15 years ago  . PONV (postoperative nausea and vomiting)     during nasal surgery   Discharge Diagnoses:   Active Problems:   Herniated lumbar intervertebral disc   Spinal stenosis, lumbar region, with neurogenic claudication  Estimated body mass index is 30.44 kg/(m^2) as calculated from the following:   Height as of this encounter: 6\' 4"  (1.93 m).   Weight as of this encounter: 113.399 kg (250 lb).  Procedure:  Procedure(s) (LRB): HEMI-laminectomy MICRODISCECTOMY L5-S1 LEFT (LEVEL 1) (Left)   Consults: None  HPI: Everardo presented to Dr. Jeannetta Ellis office with the chief complaint of back pain. He has an acute left sciatica and a partial weakness of his foot on the left. He does not recall any injury or change in activity.The pain radiates to the left buttock, left posterior thigh, left posterior lower leg and left foot. The patient describes the pain as sharp, burning, tingling and throbbing. He started experiencing these symptoms about a week ago. MRI showed that he has a large foraminal disc at L5-S1 on the left.   Laboratory Data: Admission on 08/30/2013, Discharged on 08/31/2013  Component Date Value Range Status  . ABO/RH(D) 08/30/2013 O POS   Final  . Antibody Screen 08/30/2013 NEG   Final  . Sample Expiration 08/30/2013 09/02/2013   Final  . ABO/RH(D) 08/30/2013 O POS   Final  Hospital Outpatient Visit on 08/29/2013  Component Date Value Range Status  . WBC 08/29/2013 7.7  4.0 - 10.5 K/uL Final  . RBC 08/29/2013 5.00  4.22 - 5.81 MIL/uL Final  . Hemoglobin 08/29/2013 16.7  13.0 - 17.0 g/dL Final  . HCT 56/21/3086 45.5  39.0 - 52.0 % Final  . MCV 08/29/2013 91.0  78.0  - 100.0 fL Final  . MCH 08/29/2013 33.4  26.0 - 34.0 pg Final  . MCHC 08/29/2013 36.7* 30.0 - 36.0 g/dL Final  . RDW 57/84/6962 12.1  11.5 - 15.5 % Final  . Platelets 08/29/2013 235  150 - 400 K/uL Final  . MRSA, PCR 08/29/2013 NEGATIVE  NEGATIVE Final  . Staphylococcus aureus 08/29/2013 POSITIVE* NEGATIVE Final   Comment:                                 The Xpert SA Assay (FDA                          approved for NASAL specimens                          in patients over 68 years of age),                          is one component of                          a comprehensive surveillance  program.  Test performance has                          been validated by [facility] for patients greater                          than or equal to 85 year old.                          It is not intended                          to diagnose infection nor to                          guide or monitor treatment.  Marland Kitchen aPTT 08/29/2013 29  24 - 37 seconds Final  . Sodium 08/29/2013 136  135 - 145 mEq/L Final  . Potassium 08/29/2013 3.8  3.5 - 5.1 mEq/L Final  . Chloride 08/29/2013 101  96 - 112 mEq/L Final  . CO2 08/29/2013 24  19 - 32 mEq/L Final  . Glucose, Bld 08/29/2013 99  70 - 99 mg/dL Final  . BUN 45/40/9811 21  6 - 23 mg/dL Final  . Creatinine, Ser 08/29/2013 1.11  0.50 - 1.35 mg/dL Final  . Calcium 91/47/8295 9.0  8.4 - 10.5 mg/dL Final  . Total Protein 08/29/2013 6.8  6.0 - 8.3 g/dL Final  . Albumin 62/13/0865 4.2  3.5 - 5.2 g/dL Final  . AST 78/46/9629 24  0 - 37 U/L Final  . ALT 08/29/2013 45  0 - 53 U/L Final  . Alkaline Phosphatase 08/29/2013 52  39 - 117 U/L Final  . Total Bilirubin 08/29/2013 0.6  0.3 - 1.2 mg/dL Final  . GFR calc non Af Amer 08/29/2013 79* >90 mL/min Final  . GFR calc Af Amer 08/29/2013 >90  >90 mL/min Final   Comment: (NOTE)                          The eGFR has been calculated using the CKD EPI equation.                           This calculation has not been validated in all clinical situations.                          eGFR's persistently <90 mL/min signify possible Chronic Kidney                          Disease.  Marland Kitchen Prothrombin Time 08/29/2013 13.8  11.6 - 15.2 seconds Final  . INR 08/29/2013 1.08  0.00 - 1.49 Final  . Color, Urine 08/29/2013 YELLOW  YELLOW Final  . APPearance 08/29/2013 CLEAR  CLEAR Final  . Specific Gravity, Urine 08/29/2013 1.022  1.005 - 1.030 Final  . pH 08/29/2013 7.0  5.0 - 8.0 Final  . Glucose, UA 08/29/2013 NEGATIVE  NEGATIVE mg/dL Final  . Hgb urine dipstick 08/29/2013 NEGATIVE  NEGATIVE Final  . Bilirubin Urine 08/29/2013 NEGATIVE  NEGATIVE  Final  . Ketones, ur 08/29/2013 NEGATIVE  NEGATIVE mg/dL Final  . Protein, ur 16/07/9603 NEGATIVE  NEGATIVE mg/dL Final  . Urobilinogen, UA 08/29/2013 0.2  0.0 - 1.0 mg/dL Final  . Nitrite 54/06/8118 NEGATIVE  NEGATIVE Final  . Leukocytes, UA 08/29/2013 NEGATIVE  NEGATIVE Final   MICROSCOPIC NOT DONE ON URINES WITH NEGATIVE PROTEIN, BLOOD, LEUKOCYTES, NITRITE, OR GLUCOSE <1000 mg/dL.     X-Rays:Dg Chest 2 View  08/29/2013   CLINICAL DATA:  Preop evaluation for lumbar Sir  EXAM: CHEST  2 VIEW  COMPARISON:  None.  FINDINGS: Cardiac shadow is within normal limits. The lungs are well aerated bilaterally. No focal infiltrate or sizable effusion is seen. No bony abnormality is noted.  IMPRESSION: No acute abnormality seen.   Electronically Signed   By: Alcide Clever M.D.   On: 08/29/2013 15:24   Dg Lumbar Spine 2-3 Views  08/30/2013   ADDENDUM REPORT: 08/30/2013 14:04  ADDENDUM: Numbering of lumbar vertebrae provided preoperatively at referring physican request.  Please refer to alternate presentation state to view images with numbering.   Electronically Signed   By: Ulyses Southward M.D.   On: 08/30/2013 14:04   08/30/2013   CLINICAL DATA:  Severe sciatica and low back pain  EXAM: LUMBAR SPINE - 2-3 VIEW  COMPARISON:  None.  FINDINGS: There is no  evidence of lumbar spine fracture. There is decreased intervertebral disc space at L5-S1. Minimal anterior osteophytosis is identified at L4 and L5. Several small radiopaque markers are projected in the pelvis.  IMPRESSION: Mild degenerative joint changes at L5-S1. No acute fracture or dislocation.  Electronically Signed: By: Sherian Rein M.D. On: 08/29/2013 15:48   Dg Spine Portable 1 View  08/30/2013   CLINICAL DATA:  L5-S1 left-sided microdiscectomy  EXAM: PORTABLE SPINE - 1 VIEW  COMPARISON:  Lumbar spine radiographs -08/29/2013; intraoperative spinal localization radiographs-earlier same day  FINDINGS: A single lateral spot intraoperative radiographic images of the lumbar spine is provided for review. Lumbar spine labeling is in keeping with preprocedural lumbar spine radiographs.  A radiopaque surgical marking instrument tip overlies the soft tissues about the posterior aspect of the L5-S1 intervertebral disc space. There is additional radiopaque surgical support apparatus posterior to the operative site.  IMPRESSION: Intraoperative spinal localization of L5-S1 as above.   Electronically Signed   By: Simonne Come M.D.   On: 08/30/2013 16:11   Dg Spine Portable 1 View  08/30/2013   CLINICAL DATA:  Operative localization for lumbar spine surgery.  EXAM: PORTABLE SPINE - 1 VIEW  COMPARISON:  08/30/2013.  FINDINGS: Radiopaque surgical instruments project posterior to the L5-S1 disc space.  IMPRESSION: Operative localization for lumbar spine surgery at L5-S1   Electronically Signed   By: Salome Holmes M.D.   On: 08/30/2013 15:54   Dg Spine Portable 1 View  08/30/2013   CLINICAL DATA:  Operative localization for lumbar spine surgery  EXAM: PORTABLE SPINE - 1 VIEW  COMPARISON:  08/29/2013  FINDINGS: Radiopaque needles noted posterior to L5-S1 in the lateral projection. Normal alignment.  IMPRESSION: Operative localization L5-S1   Electronically Signed   By: Ruel Favors M.D.   On: 08/30/2013 15:24     EKG: Orders placed during the hospital encounter of 08/29/13  . EKG 12-LEAD  . EKG 12-LEAD     Hospital Course: DAVONN FLANERY is a 47 y.o. who was admitted to [facility]. They were brought to the operating room on 08/30/2013 and underwent Procedure(s): HEMI-laminectomy MICRODISCECTOMY  L5-S1 LEFT (LEVEL 1).  Patient tolerated the procedure well and was later transferred to the recovery room and then to the orthopaedic floor for postoperative care.  They were given PO and IV analgesics for pain control following their surgery.  They were given 24 hours of postoperative antibiotics of  Anti-infectives   Start     Dose/Rate Route Frequency Ordered Stop   08/30/13 2200  ceFAZolin (ANCEF) IVPB 1 g/50 mL premix  Status:  Discontinued     1 g 100 mL/hr over 30 Minutes Intravenous 3 times per day 08/30/13 1800 08/31/13 1228   08/30/13 1553  polymyxin B 500,000 Units, bacitracin 50,000 Units in sodium chloride irrigation 0.9 % 500 mL irrigation  Status:  Discontinued       As needed 08/30/13 1553 08/30/13 1654   08/30/13 1245  ceFAZolin (ANCEF) IVPB 2 g/50 mL premix     2 g 100 mL/hr over 30 Minutes Intravenous On call to O.R. 08/30/13 1231 08/30/13 1438     and started on DVT prophylaxis in the form of Aspirin.   PT was ordered.  Discharge planning consulted to help with postop disposition and equipment needs.  Patient had a good night on the evening of surgery.  They started to get up OOB with therapy on day one. Patient was seen in rounds and was ready to go home.   Discharge Medications: Prior to Admission medications   Medication Sig Start Date End Date Taking? Authorizing Provider  methocarbamol (ROBAXIN) 500 MG tablet Take 1 tablet (500 mg total) by mouth every 6 (six) hours as needed for muscle spasms. 08/31/13   Hudson Majkowski Tamala Ser, PA-C  oxyCODONE (ROXICODONE) 5 MG immediate release tablet Take 1-2 tablets (5-10 mg total) by mouth every 4 (four) hours as needed for  severe pain. 08/31/13   Alphonzo Devera Tamala Ser, PA-C    Diet: Regular diet Activity:WBAT Follow-up:in 2 weeks Disposition - Home Discharged Condition: good   Discharge Orders   Future Orders Complete By Expires   Call MD / Call 911  As directed    Comments:     If you experience chest pain or shortness of breath, CALL 911 and be transported to the hospital emergency room.  If you develope a fever above 101 F, pus (white drainage) or increased drainage or redness at the wound, or calf pain, call your surgeon's office.   Constipation Prevention  As directed    Comments:     Drink plenty of fluids.  Prune juice may be helpful.  You may use a stool softener, such as Colace (over the counter) 100 mg twice a day.  Use MiraLax (over the counter) for constipation as needed.   Diet general  As directed    Discharge instructions  As directed    Comments:     You may shower starting Thursday For the first few days, remove your dressing, tape a piece of saran wrap over your incision, take your shower, then remove the saran wrap and put a clean dressing on. After two days you can shower without the saran wrap.  Call Dr. Darrelyn Hillock if any wound complications or temperature of 101 degrees F or over.  Call the office for an appointment to see Dr. Darrelyn Hillock in two weeks: [phone] and ask for Dr. Jeannetta Ellis nurse, Mackey Birchwood.   Driving restrictions  As directed    Comments:     No driving for 1 week or while on pain medication   Increase activity slowly as  tolerated  As directed    Lifting restrictions  As directed    Comments:     No lifting       Medication List    STOP taking these medications       HYDROcodone-acetaminophen 5-325 MG per tablet  Commonly known as:  NORCO/VICODIN     oxycodone-acetaminophen 2.5-325 MG per tablet  Commonly known as:  PERCOCET      TAKE these medications       methocarbamol 500 MG tablet  Commonly known as:  ROBAXIN  Take 1 tablet (500 mg total) by  mouth every 6 (six) hours as needed for muscle spasms.     oxyCODONE 5 MG immediate release tablet  Commonly known as:  ROXICODONE  Take 1-2 tablets (5-10 mg total) by mouth every 4 (four) hours as needed for severe pain.           Follow-up Information   Follow up with GIOFFRE,RONALD A, MD. Schedule an appointment as soon as possible for a visit in 2 weeks.   Specialty:  Orthopedic Surgery   Contact information:   [address] [place] [address] [phone]       Signed: Dimitri Ped [facility] 09/05/2013, 7:42 AM

## 2015-11-16 IMAGING — CR DG SPINE 1V PORT
1 series · 1 of 1 positions shown · non-contrast
Comparison: 08/30/2013.

CLINICAL DATA: Operative localization for lumbar spine surgery.

EXAM:
PORTABLE SPINE - 1 VIEW

[lateral]
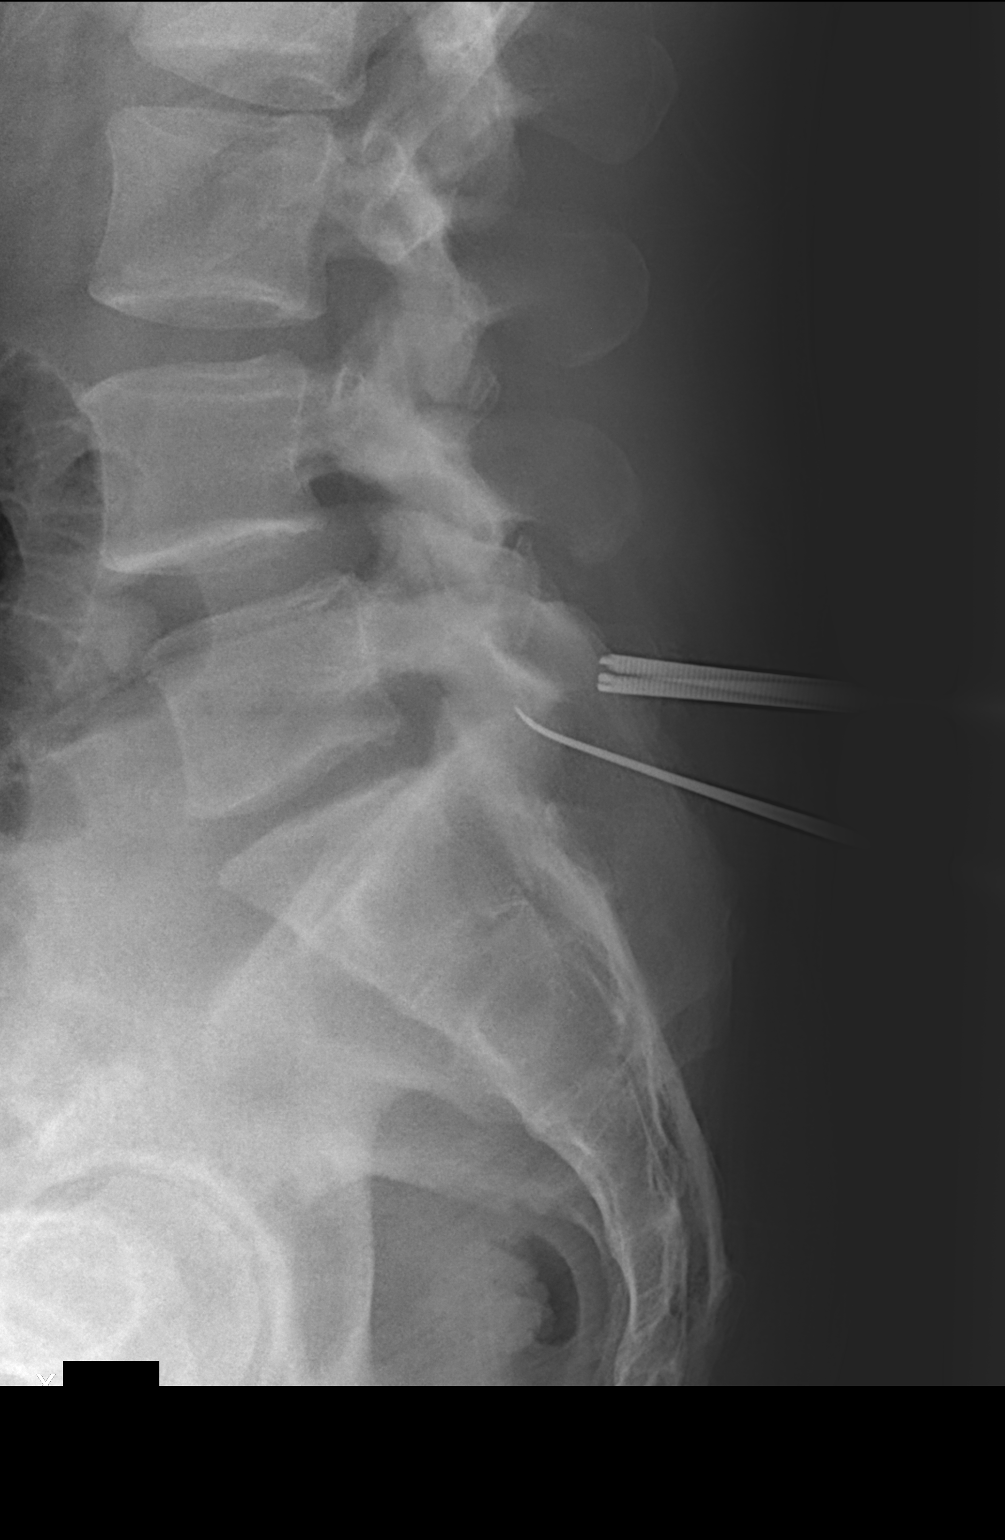

[1 of 1 positions shown; findings below may reference images not displayed]

FINDINGS: Radiopaque surgical instruments project posterior to the L5-S1 disc
space.
IMPRESSION: Operative localization for lumbar spine surgery at L5-S1

## 2018-07-06 ENCOUNTER — Encounter (HOSPITAL_BASED_OUTPATIENT_CLINIC_OR_DEPARTMENT_OTHER): Payer: Self-pay | Admitting: Emergency Medicine

## 2018-07-06 ENCOUNTER — Other Ambulatory Visit: Payer: Self-pay

## 2018-07-06 ENCOUNTER — Emergency Department (HOSPITAL_BASED_OUTPATIENT_CLINIC_OR_DEPARTMENT_OTHER)
Admission: EM | Admit: 2018-07-06 | Discharge: 2018-07-06 | Disposition: A | Discharge status: Home / Self Care | Payer: BC Managed Care – PPO | Attending: Emergency Medicine | Admitting: Emergency Medicine

## 2018-07-06 DIAGNOSIS — R1032 Left lower quadrant pain: Secondary | ICD-10-CM | POA: Diagnosis not present

## 2018-07-06 DIAGNOSIS — R319 Hematuria, unspecified: Secondary | ICD-10-CM | POA: Diagnosis not present

## 2018-07-06 DIAGNOSIS — R109 Unspecified abdominal pain: Secondary | ICD-10-CM

## 2018-07-06 LAB — URINALYSIS, ROUTINE W REFLEX MICROSCOPIC
Bilirubin Urine: NEGATIVE
Glucose, UA: NEGATIVE mg/dL
Ketones, ur: NEGATIVE mg/dL
LEUKOCYTES UA: NEGATIVE
Nitrite: NEGATIVE
PH: 6.5 (ref 5.0–8.0)
PROTEIN: NEGATIVE mg/dL
Specific Gravity, Urine: 1.02 (ref 1.005–1.030)

## 2018-07-06 LAB — CBC WITH DIFFERENTIAL/PLATELET
BASOS PCT: 1 %
Basophils Absolute: 0 10*3/uL (ref 0.0–0.1)
Eosinophils Absolute: 0.1 10*3/uL (ref 0.0–0.7)
Eosinophils Relative: 3 %
HCT: 45.7 % (ref 39.0–52.0)
HEMOGLOBIN: 16.2 g/dL (ref 13.0–17.0)
Lymphocytes Relative: 23 %
Lymphs Abs: 1.2 10*3/uL (ref 0.7–4.0)
MCH: 33.5 pg (ref 26.0–34.0)
MCHC: 35.4 g/dL (ref 30.0–36.0)
MCV: 94.6 fL (ref 78.0–100.0)
Monocytes Absolute: 0.5 10*3/uL (ref 0.1–1.0)
Monocytes Relative: 9 %
NEUTROS PCT: 64 %
Neutro Abs: 3.3 10*3/uL (ref 1.7–7.7)
Platelets: 222 10*3/uL (ref 150–400)
RBC: 4.83 MIL/uL (ref 4.22–5.81)
RDW: 12.7 % (ref 11.5–15.5)
WBC: 5.1 10*3/uL (ref 4.0–10.5)

## 2018-07-06 LAB — COMPREHENSIVE METABOLIC PANEL
ALBUMIN: 4.5 g/dL (ref 3.5–5.0)
ALK PHOS: 56 U/L (ref 38–126)
ALT: 41 U/L (ref 0–44)
ANION GAP: 9 (ref 5–15)
AST: 26 U/L (ref 15–41)
BUN: 27 mg/dL — ABNORMAL HIGH (ref 6–20)
CO2: 27 mmol/L (ref 22–32)
Calcium: 9.3 mg/dL (ref 8.9–10.3)
Chloride: 104 mmol/L (ref 98–111)
Creatinine, Ser: 1.22 mg/dL (ref 0.61–1.24)
GFR calc Af Amer: >60 mL/min (ref >60)
GFR calc non Af Amer: >60 mL/min (ref >60)
GLUCOSE: 113 mg/dL — AB (ref 70–99)
Potassium: 4.7 mmol/L (ref 3.5–5.1)
SODIUM: 140 mmol/L (ref 135–145)
Total Bilirubin: 0.7 mg/dL (ref 0.3–1.2)
Total Protein: 7.2 g/dL (ref 6.5–8.1)

## 2018-07-06 LAB — URINALYSIS, MICROSCOPIC (REFLEX): RBC / HPF: >50 RBC/hpf (ref 0–5)

## 2018-07-06 LAB — LIPASE, BLOOD: Lipase: 27 U/L (ref 11–51)

## 2018-07-06 MED ORDER — OXYCODONE HCL 5 MG PO TABS: 5.0000 mg | ORAL_TABLET | Freq: Four times a day (QID) | ORAL | 0 refills | Status: DC | PRN

## 2018-07-06 MED ORDER — TAMSULOSIN HCL 0.4 MG PO CAPS: 0.4000 mg | ORAL_CAPSULE | Freq: Every day | ORAL | 0 refills | Status: DC

## 2018-07-06 MED FILL — oxyCODONE HCL 5 MG TABS: 5 | 2 days supply | Qty: 10 | Fill #0

## 2018-07-06 MED FILL — TAMSULOSIN HCL 0.4 MG CAP: 0.4 | 7 days supply | Qty: 7 | Fill #0

## 2018-07-06 NOTE — ED Triage Notes (Signed)
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

## 2018-07-06 NOTE — ED Provider Notes (Signed)
[facility] EMERGENCY DEPARTMENT Provider Note   CSN: [id number] Arrival date & time: 07/06/18  1430     History   Chief Complaint Chief Complaint  Patient presents with  . Abdominal Pain    HPI Willie Davis is a 52 y.o. male.  The history is provided by the patient. No language interpreter was used.  Abdominal Pain       52 year old male with prior history of cholecystectomy, appendectomy, hernia repair presenting for evaluation of abdominal pain.  Patient report approximately 2 hours ago he developed acute onset of pain primarily to his left lower quadrant.  Pain is sharp, shooting, very intense, mildly improved when he leans forward.  EMS was called but after approximately 15 minutes, his pain since subside.  States pain is currently 2 out of 10.  Patient felt that this could be a kidney stone but denies any prior history of kidney stone.  Denies any fever, chills, lightheadedness, dizziness, chest pain, shortness of breath, productive cough, nausea, vomiting, diarrhea, constipation.  Last bowel movement was this morning and was normal.  Able to pass flatus.  No prior history of small bowel obstruction in the past.  No specific treatment tried.  Denies any recent heavy lifting or strenuous activities or injury.  No complaint of penis pain testicular pain hematuria or penile discharge.  Past Medical History:  Diagnosis Date  . Hx MRSA infection 15 years ago  . Pneumonia    age 76  . PONV (postoperative nausea and vomiting)    during nasal surgery    Patient Active Problem List   Diagnosis Date Noted  . Herniated lumbar intervertebral disc 08/30/2013  . Spinal stenosis, lumbar region, with neurogenic claudication 08/30/2013    Past Surgical History:  Procedure Laterality Date  . APPENDECTOMY  1986  . CHOLECYSTECTOMY  1986  . HEMI-MICRODISCECTOMY LUMBAR LAMINECTOMY LEVEL 1 Left 08/30/2013   Procedure: HEMI-laminectomy MICRODISCECTOMY L5-S1 LEFT (LEVEL 1);   Surgeon: Jacki Cones, MD;  Location: [facility];  Service: Orthopedics;  Laterality: Left;  . HERNIA REPAIR     double  . KNEE SURGERY Bilateral   . NASAL SEPTUM SURGERY  at least 12 years ago   x2  . VASECTOMY          Home Medications    Prior to Admission medications   Medication Sig Start Date End Date Taking? Authorizing Provider  methocarbamol (ROBAXIN) 500 MG tablet Take 1 tablet (500 mg total) by mouth every 6 (six) hours as needed for muscle spasms. 08/31/13   Constable, Amber, PA-C  oxyCODONE (ROXICODONE) 5 MG immediate release tablet Take 1-2 tablets (5-10 mg total) by mouth every 4 (four) hours as needed for severe pain. 08/31/13   Dimitri Ped, PA-C    Family History No family history on file.  Social History Social History   Tobacco Use  . Smoking status: Never Smoker  . Smokeless tobacco: Never Used  Substance Use Topics  . Alcohol use: Yes    Comment: occasional  . Drug use: No     Allergies   Allegra-d [fexofenadine-pseudoephed er]; Hydromorphone; and Ivp dye [iodinated diagnostic agents]   Review of Systems Review of Systems  Gastrointestinal: Positive for abdominal pain.  All other systems reviewed and are negative.    Physical Exam Updated Vital Signs Ht 6\' 4"  (1.93 m)   Wt 113.4 kg   BMI 30.43 kg/m   Physical Exam  Constitutional: He appears well-developed and well-nourished. No distress.  HENT:  Head: Atraumatic.  Eyes: Conjunctivae are normal.  Neck: Neck supple.  Cardiovascular: Normal rate and regular rhythm.  Pulmonary/Chest: Effort normal and breath sounds normal.  Abdominal: Soft. Normal appearance. There is no tenderness.  Neurological: He is alert.  Skin: No rash noted.  Psychiatric: He has a normal mood and affect.  Nursing note and vitals reviewed.    ED Treatments / Results  Labs (all labs ordered are listed, but only abnormal results are displayed) Labs Reviewed  COMPREHENSIVE METABOLIC PANEL - Abnormal;  Notable for the following components:      Result Value   Glucose, Bld 113 (*)    BUN 27 (*)    All other components within normal limits  URINALYSIS, ROUTINE W REFLEX MICROSCOPIC - Abnormal; Notable for the following components:   Hgb urine dipstick LARGE (*)    All other components within normal limits  URINALYSIS, MICROSCOPIC (REFLEX) - Abnormal; Notable for the following components:   Bacteria, UA FEW (*)    All other components within normal limits  CBC WITH DIFFERENTIAL/PLATELET  LIPASE, BLOOD    EKG None  Radiology No results found.  Procedures Procedures (including critical care time)  Medications Ordered in ED Medications - No data to display   Initial Impression / Assessment and Plan / ED Course  I have reviewed the triage vital signs and the nursing notes.  Pertinent labs & imaging results that were available during my care of the patient were reviewed by me and considered in my medical decision making (see chart for details).     Ht 6\' 4"  (1.93 m)   Wt 113.4 kg   BMI 30.43 kg/m    Final Clinical Impressions(s) / ED Diagnoses   Final diagnoses:  Left flank pain  Hematuria, unspecified type    ED Discharge Orders         Ordered    oxyCODONE (ROXICODONE) 5 MG immediate release tablet  Every 6 hours PRN     07/06/18 1639    tamsulosin (FLOMAX) 0.4 MG CAPS capsule  Daily     07/06/18 1639         3:21 PM Patient with acute onset of pain to his left lower quadrant which he felt could be due to a kidney stone despite not having history of kidney stones in the past.  Pain came and went and right now he does not have any reproducible pain on exam.  He is resting comfortably.  Work-up initiated.  4:36 PM Labs are reassuring, urine shows large amounts of hemoglobin and urine dipstick suggestive of a possible kidney stone.  On reexamination, patient is resting comfortably.  Patient however voiced concern of another attack since he is going [place] a [place]  trip in 11 days.  I encourage patient to drink plenty of fluid, and will also prescribe pain medication and Flomax in the event of another attack.  Patient was then he can return for further care.  We will forego advanced imaging at this time.   Fayrene Helper, PA-C 07/06/18 1642    Tegeler, Canary Brim, MD 07/06/18 [phone]

## 2018-07-06 NOTE — Discharge Instructions (Signed)
Your pain is likely due to kidney stone. Please follow up with Urology as needed if your condition worsen.  Take medication prescribed if your pain return.  Drink plenty of fluid.

## 2018-07-30 ENCOUNTER — Ambulatory Visit (HOSPITAL_COMMUNITY): Payer: BC Managed Care – PPO | Admitting: Certified Registered Nurse Anesthetist

## 2018-07-30 ENCOUNTER — Encounter (HOSPITAL_COMMUNITY): Payer: Self-pay | Admitting: *Deleted

## 2018-07-30 ENCOUNTER — Other Ambulatory Visit: Payer: Self-pay | Admitting: Urology

## 2018-07-30 ENCOUNTER — Ambulatory Visit (HOSPITAL_COMMUNITY)
Admission: AD | Admit: 2018-07-30 | Discharge: 2018-07-30 | Disposition: A | Discharge status: Home / Self Care | Payer: BC Managed Care – PPO | Source: Other Acute Inpatient Hospital | Attending: Urology | Admitting: Urology

## 2018-07-30 ENCOUNTER — Encounter (HOSPITAL_COMMUNITY)
Admission: AD | Disposition: A | Discharge status: Home / Self Care | Payer: Self-pay | Source: Other Acute Inpatient Hospital | Attending: Urology

## 2018-07-30 ENCOUNTER — Ambulatory Visit (HOSPITAL_COMMUNITY): Payer: BC Managed Care – PPO

## 2018-07-30 DIAGNOSIS — Z888 Allergy status to other drugs, medicaments and biological substances status: Secondary | ICD-10-CM | POA: Diagnosis not present

## 2018-07-30 DIAGNOSIS — N132 Hydronephrosis with renal and ureteral calculous obstruction: Secondary | ICD-10-CM | POA: Insufficient documentation

## 2018-07-30 DIAGNOSIS — Z91041 Radiographic dye allergy status: Secondary | ICD-10-CM | POA: Diagnosis not present

## 2018-07-30 DIAGNOSIS — N3289 Other specified disorders of bladder: Secondary | ICD-10-CM | POA: Insufficient documentation

## 2018-07-30 DIAGNOSIS — N201 Calculus of ureter: Secondary | ICD-10-CM

## 2018-07-30 HISTORY — PX: CYSTOSCOPY/URETEROSCOPY/HOLMIUM LASER/STENT PLACEMENT: SHX6546

## 2018-07-30 SURGERY — CYSTOSCOPY/URETEROSCOPY/HOLMIUM LASER/STENT PLACEMENT
Anesthesia: General | Site: Ureter | Laterality: Left

## 2018-07-30 MED ORDER — FENTANYL CITRATE (PF) 100 MCG/2ML IJ SOLN: 25.0000 ug | INTRAMUSCULAR | Status: DC | PRN

## 2018-07-30 MED ORDER — PROPOFOL 10 MG/ML IV BOLUS
INTRAVENOUS | Status: AC
  Filled 2018-07-30: qty 20

## 2018-07-30 MED ORDER — SODIUM CHLORIDE 0.9 % IV SOLN: 250.0000 mL | INTRAVENOUS | Status: DC | PRN

## 2018-07-30 MED ORDER — LIDOCAINE 2% (20 MG/ML) 5 ML SYRINGE
INTRAMUSCULAR | Status: AC
  Filled 2018-07-30: qty 5

## 2018-07-30 MED ORDER — SODIUM CHLORIDE 0.9% FLUSH: 3.0000 mL | Freq: Two times a day (BID) | INTRAVENOUS | Status: DC

## 2018-07-30 MED ORDER — MIDAZOLAM HCL 2 MG/2ML IJ SOLN
INTRAMUSCULAR | Status: DC | PRN
  Administered 2018-07-30: 2 mg via INTRAVENOUS

## 2018-07-30 MED ORDER — DEXAMETHASONE SODIUM PHOSPHATE 4 MG/ML IJ SOLN
INTRAMUSCULAR | Status: DC | PRN
  Administered 2018-07-30: 10 mg via INTRAVENOUS

## 2018-07-30 MED ORDER — SODIUM CHLORIDE 0.9% FLUSH: 3.0000 mL | INTRAVENOUS | Status: DC | PRN

## 2018-07-30 MED ORDER — ACETAMINOPHEN 325 MG PO TABS: 650.0000 mg | ORAL_TABLET | ORAL | Status: DC | PRN

## 2018-07-30 MED ORDER — FENTANYL CITRATE (PF) 100 MCG/2ML IJ SOLN
INTRAMUSCULAR | Status: AC
  Filled 2018-07-30: qty 2

## 2018-07-30 MED ORDER — APREPITANT 40 MG PO CAPS
ORAL_CAPSULE | ORAL | Status: AC
  Administered 2018-07-30: 40 mg
  Filled 2018-07-30: qty 1

## 2018-07-30 MED ORDER — LIDOCAINE 2% (20 MG/ML) 5 ML SYRINGE
INTRAMUSCULAR | Status: DC | PRN
  Administered 2018-07-30: 100 mg via INTRAVENOUS

## 2018-07-30 MED ORDER — SODIUM CHLORIDE 0.9 % IR SOLN
Status: DC | PRN
  Administered 2018-07-30: 1000 mL

## 2018-07-30 MED ORDER — PROPOFOL 10 MG/ML IV BOLUS
INTRAVENOUS | Status: AC
  Filled 2018-07-30: qty 60

## 2018-07-30 MED ORDER — PHENAZOPYRIDINE HCL 200 MG PO TABS: 200.0000 mg | ORAL_TABLET | Freq: Three times a day (TID) | ORAL | 0 refills | Status: DC | PRN

## 2018-07-30 MED ORDER — CEFAZOLIN SODIUM-DEXTROSE 2-4 GM/100ML-% IV SOLN
2.0000 g | INTRAVENOUS | Status: AC
  Administered 2018-07-30: 2 g via INTRAVENOUS
  Filled 2018-07-30: qty 100

## 2018-07-30 MED ORDER — ONDANSETRON HCL 4 MG/2ML IJ SOLN
INTRAMUSCULAR | Status: AC
  Filled 2018-07-30: qty 2

## 2018-07-30 MED ORDER — SCOPOLAMINE 1 MG/3DAYS TD PT72
MEDICATED_PATCH | TRANSDERMAL | Status: DC | PRN
  Administered 2018-07-30: 1 via TRANSDERMAL

## 2018-07-30 MED ORDER — OXYCODONE HCL 5 MG PO TABS: 5.0000 mg | ORAL_TABLET | ORAL | Status: DC | PRN

## 2018-07-30 MED ORDER — ONDANSETRON HCL 4 MG/2ML IJ SOLN
INTRAMUSCULAR | Status: DC | PRN
  Administered 2018-07-30: 4 mg via INTRAVENOUS

## 2018-07-30 MED ORDER — LACTATED RINGERS IV SOLN: Freq: Once | INTRAVENOUS | Status: DC

## 2018-07-30 MED ORDER — SCOPOLAMINE 1 MG/3DAYS TD PT72
MEDICATED_PATCH | TRANSDERMAL | Status: AC
  Filled 2018-07-30: qty 1

## 2018-07-30 MED ORDER — LACTATED RINGERS IV SOLN
INTRAVENOUS | Status: DC
  Administered 2018-07-30: 16:00:00 via INTRAVENOUS

## 2018-07-30 MED ORDER — PROPOFOL 10 MG/ML IV BOLUS
INTRAVENOUS | Status: DC | PRN
  Administered 2018-07-30: 100 mg via INTRAVENOUS
  Administered 2018-07-30: 200 mg via INTRAVENOUS

## 2018-07-30 MED ORDER — DEXAMETHASONE SODIUM PHOSPHATE 10 MG/ML IJ SOLN
INTRAMUSCULAR | Status: AC
  Filled 2018-07-30: qty 1

## 2018-07-30 MED ORDER — PROPOFOL 500 MG/50ML IV EMUL
INTRAVENOUS | Status: DC | PRN
  Administered 2018-07-30: 100 ug/kg/min via INTRAVENOUS

## 2018-07-30 MED ORDER — ACETAMINOPHEN 650 MG RE SUPP
650.0000 mg | RECTAL | Status: DC | PRN
  Filled 2018-07-30: qty 1

## 2018-07-30 MED ORDER — MIDAZOLAM HCL 2 MG/2ML IJ SOLN
INTRAMUSCULAR | Status: AC
  Filled 2018-07-30: qty 2

## 2018-07-30 MED ORDER — SCOPOLAMINE 1 MG/3DAYS TD PT72
MEDICATED_PATCH | TRANSDERMAL | Status: AC
  Administered 2018-07-30: 1.5 mg
  Filled 2018-07-30: qty 1

## 2018-07-30 MED ORDER — FENTANYL CITRATE (PF) 100 MCG/2ML IJ SOLN
INTRAMUSCULAR | Status: DC | PRN
  Administered 2018-07-30 (×4): 50 ug via INTRAVENOUS

## 2018-07-30 SURGICAL SUPPLY — 22 items
BAG URO CATCHER STRL LF (MISCELLANEOUS) ×3 IMPLANT
CATH URET 5FR 28IN OPEN ENDED (CATHETERS) ×2 IMPLANT
CATH URET DUAL LUMEN 6-10FR 50 (CATHETERS) ×3 IMPLANT
CLOTH BEACON ORANGE TIMEOUT ST (SAFETY) ×3 IMPLANT
EXTRACTOR STONE NITINOL NGAGE (UROLOGICAL SUPPLIES) ×3 IMPLANT
FIBER LASER FLEXIVA 1000 (UROLOGICAL SUPPLIES) IMPLANT
FIBER LASER FLEXIVA 365 (UROLOGICAL SUPPLIES) ×2 IMPLANT
FIBER LASER FLEXIVA 550 (UROLOGICAL SUPPLIES) IMPLANT
FIBER LASER TRAC TIP (UROLOGICAL SUPPLIES) IMPLANT
GLOVE BIOGEL PI IND STRL 6.5 (GLOVE) IMPLANT
GLOVE BIOGEL PI INDICATOR 6.5 (GLOVE) ×2
GLOVE SURG SS PI 8.0 STRL IVOR (GLOVE) ×2 IMPLANT
GOWN STRL REUS W/TWL LRG LVL3 (GOWN DISPOSABLE) ×2 IMPLANT
GOWN STRL REUS W/TWL XL LVL3 (GOWN DISPOSABLE) ×3 IMPLANT
GUIDEWIRE STR DUAL SENSOR (WIRE) ×3 IMPLANT
MANIFOLD NEPTUNE II (INSTRUMENTS) ×3 IMPLANT
PACK CYSTO (CUSTOM PROCEDURE TRAY) ×3 IMPLANT
SHEATH URETERAL 12FRX35CM (MISCELLANEOUS) ×3 IMPLANT
STENT URET 6FRX26 CONTOUR (STENTS) ×2 IMPLANT
TUBING CONNECTING 10 (TUBING) ×2 IMPLANT
TUBING CONNECTING 10' (TUBING) ×1
TUBING UROLOGY SET (TUBING) ×3 IMPLANT

## 2018-07-30 NOTE — Anesthesia Preprocedure Evaluation (Signed)
Anesthesia Evaluation  Patient identified by MRN, date of birth, ID band Patient awake    Reviewed: Allergy & Precautions, NPO status , Patient's Chart, lab work & pertinent test results  History of Anesthesia Complications (+) PONV  Airway Mallampati: II  TM Distance: >3 FB Neck ROM: Full    Dental no notable dental hx. (+) Teeth Intact, Dental Advisory Given   Pulmonary neg pulmonary ROS,    Pulmonary exam normal breath sounds clear to auscultation       Cardiovascular negative cardio ROS Normal cardiovascular exam Rhythm:Regular Rate:Normal     Neuro/Psych negative neurological ROS  negative psych ROS   GI/Hepatic negative GI ROS, Neg liver ROS,   Endo/Other  negative endocrine ROS  Renal/GU negative Renal ROS  negative genitourinary   Musculoskeletal negative musculoskeletal ROS (+)   Abdominal   Peds  Hematology negative hematology ROS (+)   Anesthesia Other Findings Left renal stone  Reproductive/Obstetrics                             Anesthesia Physical Anesthesia Plan  ASA: II  Anesthesia Plan: General   Post-op Pain Management:    Induction: Intravenous  PONV Risk Score and Plan: 3 and Ondansetron, Dexamethasone, Midazolam, Scopolamine patch - Pre-op and TIVA  Airway Management Planned: Oral ETT and LMA  Additional Equipment:   Intra-op Plan:   Post-operative Plan: Extubation in OR  Informed Consent: I have reviewed the patients History and Physical, chart, labs and discussed the procedure including the risks, benefits and alternatives for the proposed anesthesia with the patient or authorized representative who has indicated his/her understanding and acceptance.   Dental advisory given  Plan Discussed with: CRNA  Anesthesia Plan Comments:         Anesthesia Quick Evaluation

## 2018-07-30 NOTE — Anesthesia Procedure Notes (Signed)
Procedure Name: LMA Insertion Date/Time: 07/30/2018 5:26 PM Performed by: Vanessa [place], CRNA Pre-anesthesia Checklist: Emergency Drugs available, Patient identified, Suction available and Patient being monitored Patient Re-evaluated:Patient Re-evaluated prior to induction Oxygen Delivery Method: Circle system utilized Preoxygenation: Pre-oxygenation with 100% oxygen Induction Type: IV induction Ventilation: Mask ventilation without difficulty LMA: LMA inserted LMA Size: 5.0 Number of attempts: 1 Placement Confirmation: positive ETCO2 and breath sounds checked- equal and bilateral Tube secured with: Tape Dental Injury: Teeth and Oropharynx as per pre-operative assessment

## 2018-07-30 NOTE — Transfer of Care (Signed)
Immediate Anesthesia Transfer of Care Note  Patient: Willie Davis  Procedure(s) Performed: CYSTOSCOPY/LEFT URETEROSCOPY/HOLMIUM LASER/LEFT STENT PLACEMENT (Left Ureter)  Patient Location: PACU  Anesthesia Type:General  Level of Consciousness: awake and patient cooperative  Airway & Oxygen Therapy: Patient Spontanous Breathing and Patient connected to face mask  Post-op Assessment: Report given to RN and Post -op Vital signs reviewed and stable  Post vital signs: Reviewed and stable  Last Vitals:  Vitals Value Taken Time  BP    Temp    Pulse 109 07/30/2018  6:20 PM  Resp 12 07/30/2018  6:20 PM  SpO2 100 % 07/30/2018  6:20 PM  Vitals shown include unvalidated device data.  Last Pain:  Vitals:   07/30/18 1556  TempSrc: Oral  PainSc: 0-No pain         Complications: No apparent anesthesia complications

## 2018-07-30 NOTE — H&P (Signed)
CC: I have pain in the flank.  HPI: Willie Davis is a 52 year-old male patient who is here for flank pain.  The problem is on the left side. His pain started about 07/06/2018.   Kyson was seen in the ER on 10/1 for left flank pain. He was found to have hematuria but no imaging was done. He had another episode a week later with pain and returns today with severe left flank pain with some nausea. He has no frequency and urgency. He was given flomax and oxycodone. He has had no gross hematuria.      ALLERGIES: Allegra-D TB12 Contrast Dye    MEDICATIONS: Daily Vitamin tablet Oral  Flomax 0.4 mg capsule  Oxycodone-Acetaminophen     GU PSH: None     PSH Notes: Appendectomy, Nose Surgery, Knee Surgery, Cholecystectomy, Inguinal Hernia Repair   NON-GU PSH: Appendectomy - 2008 Cholecystectomy (open) - 2008    GU PMH: Epididymitis, Epididymitis - 2014      PMH Notes:  1898-10-06 00:00:00 - Note: Normal Routine History And Physical Adult   NON-GU PMH: Psychogenic ED, Impotence, psychogenic - 2014    FAMILY HISTORY: Crohn's (Granulomatous) Colitis - Mother Family Health Status - Father alive at age 1 - 22 In Family Family Health Status - Mother's Age - Runs In Family Family Health Status Number - Runs In Family Prostate Cancer - Grandfather, Father, Brother   SOCIAL HISTORY: None    Notes: Never Smoked, Alcohol Use, Occupation:, Caffeine Use, Marital History - Currently Married   REVIEW OF SYSTEMS:    GU Review Male:   Patient reports trouble starting your stream. Patient denies have to strain to urinate , leakage of urine, penile pain, hard to postpone urination, burning/ pain with urination, get up at night to urinate, stream starts and stops, frequent urination, and erection problems.  Gastrointestinal (Upper):   Patient reports nausea and vomiting. Patient denies indigestion/ heartburn.  Gastrointestinal (Lower):   Patient denies diarrhea and constipation.  Constitutional:    Patient denies fever, night sweats, weight loss, and fatigue.  Skin:   Patient denies skin rash/ lesion and itching.  Eyes:   Patient denies blurred vision and double vision.  Ears/ Nose/ Throat:   Patient denies sore throat and sinus problems.  Hematologic/Lymphatic:   Patient denies swollen glands and easy bruising.  Cardiovascular:   Patient denies leg swelling and chest pains.  Respiratory:   Patient denies cough and shortness of breath.  Endocrine:   Patient denies excessive thirst.  Musculoskeletal:   Patient denies back pain and joint pain.  Neurological:   Patient denies headaches and dizziness.  Psychologic:   Patient denies depression and anxiety.   VITAL SIGNS:      07/30/2018 10:39 AM  Weight 250 lb / 113.4 kg  BP 144/91 mmHg  Pulse 79 /min  Temperature 98.5 F / 36.9 C   MULTI-SYSTEM PHYSICAL EXAMINATION:    Constitutional: Well-nourished. No physical deformities. Normally developed. Good grooming. ill appearing in pain.   Neck: Neck symmetrical, not swollen. Normal tracheal position.  Respiratory: No labored breathing, no use of accessory muscles. CTA  Cardiovascular: Normal temperature, normal extremity pulses, no swelling, no varicosities. RRR without murmur  Lymphatic: No enlargement, no tenderness of supraclavicular, neck lymph nodes.  Skin: No paleness, no jaundice, no cyanosis. No lesion, no ulcer, no rash.  Neurologic / Psychiatric: Oriented to time, oriented to place, oriented to person. No depression, no anxiety, no agitation.  Gastrointestinal: Abdominal tenderness, LLQ. No  mass, no rigidity, non obese abdomen.   Musculoskeletal: Normal gait and station of head and neck.     PAST DATA REVIEWED:  Source Of History:  Patient  Records Review:   Previous Hospital Records  Urine Test Review:   Urinalysis  X-Ray Review: C.T. Stone Protocol: Reviewed Films. Discussed With Patient. 4.34m left mid ureteral stone.    10/11/07  PSA  Total PSA 0.85     07/30/07   Hormones  Testosterone, Total 3.67    Notes:                     ER records and labs with UA reviewed.    PROCEDURES:         C.T. Urogram - 780998 4.562mleft mid ureteral stone with hydro.                Ketoralac 6025m [id number]: 60 Adm. By: WhiLoree Feerly  Unit: mg Lot No [id number]: IM Exp. Date 11/26/2017  Freq: None Mfgr.:   Site: Left Buttock         Morphine [id number] Qty: 4 Adm. By: WhitLoree Feely  Unit: mg Lot No [id number]: IM Exp. Date 04/06/2019  Freq: None Mfgr.:   Site: Right Buttock   ASSESSMENT:      ICD-10 Details  1 GU:   Flank Pain - R10.84 He has a 4.5 mm left mid ureteral stone and finally got relief with Morphine after Toradol. I reviewed the options including further MET, ESWL and URS. He would like to proceed with URS this evening. I have reviewed the risks of ureteroscopy including bleeding, infection, ureteral injury, need for a stent or secondary procedures, thrombotic events and anesthetic complications.   2   Microscopic hematuria - R31.21   3   Ureteral calculus - N20.1    PLAN:            Medications New Meds: Tamsulosin Hcl 0.4 mg capsule 1 capsule PO Daily   #30  0 Refill(s)  Hydromorphone Hcl 2 mg tablet 1-2 tablet PO Q 6 H   #15  0 Refill(s)            Orders X-Rays: C.T. Stone Protocol Without Contrast  X-Ray Notes: History:  Hematuria: Yes/No  Patient to see MD after exam: Yes/No  Previous exam: CT / IVP/ US/ KUB/ None  When:  Where:  Diabetic: Yes/ No  BUN/ Creatinine:  Date of last BUN Creatinine:  Weight in pounds:  Allergy- IV Contrast: Yes/ No  Conflicting diabetic meds: Yes/ No  Diabetic Meds:  Prior Authorization #: [id number]        Schedule Return Visit/Planned Activity: ASAP - Schedule Surgery  Procedure: 07/30/2018 [facility] [address] 15 Mg) - [id number]     Document

## 2018-07-30 NOTE — Discharge Instructions (Signed)

## 2018-07-30 NOTE — Op Note (Signed)
Procedure: 1.  Cystoscopy with left ureteroscopy with holmium laser application, stone removal and insertion of left double-J stent.  Preop diagnosis: 5 mm left mid ureteral stone.  Postop diagnosis: Same.  Surgeon: Dr. Bjorn Pippin.  Anesthesia: General.  Specimen: Stone fragments.  Drain: 6 [place] by 26 cm left contour double-J stent with tether.  EBL: Minimal.  Complications: None.  Indications: Willie Davis is a 52 year old white male who presented today with his third episode of left flank pain that was severe and required Toradol and morphine to relieve.  I reviewed the options for treatment with him including continued medical therapy, ureteroscopy and lithotripsy.  He wants to go ahead with ureteroscopy tonight.  Procedure: He was taken operating room where he was given antibiotic.  A general anesthetic was induced.  He was placed in lithotomy position and fitted with PAS hose.  His perineum and genitalia were prepped with Betadine solution he was draped in usual sterile fashion.  Cystoscopy was performed using a 23 [place] scope and 30 degree lens.  Examination revealed a normal urethra, the external sphincter was intact in the prostatic urethra short without significant obstruction.  Examination of bladder revealed mild trabeculation but no tumor stones or inflammation.  Ureteral orifices were unremarkable  A 5 French opening catheter was used to stiffen a sensor guidewire which was passed by the stone which was visible on fluoroscopy.  The wire was passed to the kidney.  A 4-1/2 French semirigid ureteroscope was then passed alongside the wire and was successfully negotiated to the level of the stone in the mid ureter which was at the level of the vessels.  An initial attempt to remove the stone with an engage basket was unsuccessful due to the size of the stone.  The stone was released and a 365 m laser fiber was passed.  The power was set on 0.5 J and 10 Hz initially but the power was  increased to 0.8 J because of the durability of the stone.  The stone was eventually broken and the manageable fragments which were sequentially removed with the engage basket.  There was one larger fragment that would not be removed and I had increasing difficulty bringing the laser to bear on the stone.  I then switched to the 6.5 French semirigid ureteroscope which I was able to negotiate above the mid ureter and grasp the stone with the engage basket.  With the additional ureteral dilation afforded by the larger scope I was able to remove the stone fragment.  At this point there appeared to be only dust above the level of the stone impaction.  There was some mild mucosal blanching from the laser fiber and some bleeding as well and it was felt that stenting was indicated.  The ureteroscope was removed and the cystoscope was reinserted over the wire.  A 6 French by 26 cm contour double-J stent was then advanced to the kidney under fluoroscopic guidance.  The wire was removed leaving good coil in the kidney and a good coil in the bladder.  The bladder was then drained and the cystoscope was removed leaving the stent string exiting the urethra.  The cystoscope was reinserted alongside the string and some additional small fragments were evacuated from the bladder.  The stent string was then secured to the patient's penis and final fluoroscopy revealed good stent position.  He was taken down from lithotomy position, his anesthetic was reversed and he was moved to recovery room in stable condition.  There  were no complications.  The stone fragments were given to his family.

## 2018-08-01 NOTE — Anesthesia Postprocedure Evaluation (Signed)
Anesthesia Post Note  Patient: Willie Davis  Procedure(s) Performed: CYSTOSCOPY/LEFT URETEROSCOPY/HOLMIUM LASER/LEFT STENT PLACEMENT (Left Ureter)     Patient location during evaluation: PACU Anesthesia Type: General Level of consciousness: awake and alert Pain management: pain level controlled Vital Signs Assessment: post-procedure vital signs reviewed and stable Respiratory status: spontaneous breathing, nonlabored ventilation, respiratory function stable and patient connected to nasal cannula oxygen Cardiovascular status: blood pressure returned to baseline and stable Postop Assessment: no apparent nausea or vomiting Anesthetic complications: no    Last Vitals:  Vitals:   07/30/18 1845 07/30/18 1910  BP: (!) 123/91 123/87  Pulse: 78 71  Resp: 13 16  Temp:  36.8 C  SpO2: 96% 97%    Last Pain:  Vitals:   07/30/18 1845  TempSrc:   PainSc: 0-No pain                 Tyrick Dunagan L Ladon Heney

## 2018-08-02 ENCOUNTER — Encounter (HOSPITAL_COMMUNITY): Payer: Self-pay | Admitting: Urology

## 2018-09-21 ENCOUNTER — Other Ambulatory Visit: Payer: Self-pay | Admitting: Orthopedic Surgery

## 2019-08-30 ENCOUNTER — Other Ambulatory Visit: Payer: Self-pay | Admitting: Orthopedic Surgery

## 2019-12-03 ENCOUNTER — Ambulatory Visit: Payer: BC Managed Care – PPO

## 2019-12-30 ENCOUNTER — Other Ambulatory Visit: Payer: Self-pay | Admitting: Orthopedic Surgery

## 2020-01-02 ENCOUNTER — Ambulatory Visit: Payer: BC Managed Care – PPO | Attending: Internal Medicine

## 2020-01-02 ENCOUNTER — Ambulatory Visit: Payer: BC Managed Care – PPO

## 2020-01-02 DIAGNOSIS — Z23 Encounter for immunization: Secondary | ICD-10-CM

## 2020-01-02 NOTE — Progress Notes (Signed)
   Covid-19 Vaccination Clinic  Name:  Willie Davis    MRN: [id number] DOB: 1966-06-05  01/02/2020  Mr. Willie Davis was observed post Covid-19 immunization for 15 minutes without incident. He was provided with Vaccine Information Sheet and instruction to access the V-Safe system.   Mr. Willie Davis was instructed to call 911 with any severe reactions post vaccine: Marland Kitchen Difficulty breathing  . Swelling of face and throat  . A fast heartbeat  . A bad rash all over body  . Dizziness and weakness   Immunizations Administered    Name Date Dose VIS Date Route   Pfizer COVID-19 Vaccine 01/02/2020 11:37 AM 0.3 mL 09/16/2019 Intramuscular   Manufacturer: [employer], [employer]   Lot: [id number]   NDC: [address]-5

## 2020-01-25 ENCOUNTER — Ambulatory Visit: Payer: BC Managed Care – PPO

## 2020-01-28 ENCOUNTER — Ambulatory Visit: Payer: BC Managed Care – PPO | Attending: Internal Medicine

## 2020-01-28 DIAGNOSIS — Z23 Encounter for immunization: Secondary | ICD-10-CM

## 2020-01-28 NOTE — Progress Notes (Signed)
   Covid-19 Vaccination Clinic  Name:  SAFWAN TOMEI    MRN: [id number] DOB: 1966/07/04  01/28/2020  Mr. Dewalt was observed post Covid-19 immunization for 15 minutes without incident. He was provided with Vaccine Information Sheet and instruction to access the V-Safe system.   Mr. Bramel was instructed to call 911 with any severe reactions post vaccine: Marland Kitchen Difficulty breathing  . Swelling of face and throat  . A fast heartbeat  . A bad rash all over body  . Dizziness and weakness   Immunizations Administered    Name Date Dose VIS Date Route   Pfizer COVID-19 Vaccine 01/28/2020  8:13 AM 0.3 mL 11/30/2018 Intramuscular   Manufacturer: [employer], [employer]   Lot: [id number]   NDC: [address]-0

## 2022-09-30 ENCOUNTER — Other Ambulatory Visit: Payer: Self-pay | Admitting: Orthopedic Surgery

## 2024-04-19 ENCOUNTER — Ambulatory Visit (HOSPITAL_COMMUNITY): Payer: Self-pay | Admitting: Physician Assistant

## 2024-04-21 NOTE — Progress Notes (Signed)
 Surgical Instructions   Your procedure is scheduled on Monday May 02, 2024. Report to [facility] Main Entrance A at 5:30 A.M., then check in with the Admitting office. Any questions or running late day of surgery: call [phone]  Questions prior to your surgery date: call [phone], Monday-Friday, 8am-4pm. If you experience any cold or flu symptoms such as cough, fever, chills, shortness of breath, etc. between now and your scheduled surgery, please notify us  at the above number.     Remember:  Do not eat after midnight the night before your surgery  You may drink clear liquids until 4:30 the morning of your surgery.   Clear liquids allowed are: Water, Non-Citrus Juices (without pulp), Carbonated Beverages, Clear Tea (no milk, honey, etc.), Black Coffee Only (NO MILK, CREAM OR POWDERED CREAMER of any kind), and Gatorade.    Take these medicines if needed the morning of surgery with A SIP OF WATER  Azelastine-Fluticasone 137-50 MCG/ACT SUSP    One week prior to surgery, STOP taking any Aspirin (unless otherwise instructed by your surgeon) Aleve, Naproxen, Ibuprofen, Motrin, Advil, Goody's, BC's, all herbal medications, fish oil, and non-prescription vitamins.                     Do NOT Smoke (Tobacco/Vaping) for 24 hours prior to your procedure.  If you use a CPAP at night, you may bring your mask/headgear for your overnight stay.   You will be asked to remove any contacts, glasses, piercing's, hearing aid's, dentures/partials prior to surgery. Please bring cases for these items if needed.    Patients discharged the day of surgery will not be allowed to drive home, and someone needs to stay with them for 24 hours.  SURGICAL WAITING ROOM VISITATION Patients may have no more than 2 support people in the waiting area - these visitors may rotate.   Pre-op nurse will coordinate an appropriate time for 1 ADULT support person, who may not rotate, to accompany patient in pre-op.   Children under the age of 44 must have an adult with them who is not the patient and must remain in the main waiting area with an adult.  If the patient needs to stay at the hospital during part of their recovery, the visitor guidelines for inpatient rooms apply.  Please refer to the [facility] website for the visitor guidelines for any additional information.   If you received a COVID test during your pre-op visit  it is requested that you wear a mask when out in public, stay away from anyone that may not be feeling well and notify your surgeon if you develop symptoms. If you have been in contact with anyone that has tested positive in the last 10 days please notify you surgeon.      Pre-operative 5 CHG Bathing Instructions   You can play a key role in reducing the risk of infection after surgery. Your skin needs to be as free of germs as possible. You can reduce the number of germs on your skin by washing with CHG (chlorhexidine gluconate) soap before surgery. CHG is an antiseptic soap that kills germs and continues to kill germs even after washing.   DO NOT use if you have an allergy to chlorhexidine/CHG or antibacterial soaps. If your skin becomes reddened or irritated, stop using the CHG and notify one of our RNs at [phone].   Please shower with the CHG soap starting 4 days before surgery using the following schedule:  Please keep in mind the following:  You may shave your face at any point before/day of surgery.  Place clean sheets on your bed the day you start using CHG soap. Use a clean washcloth (not used since being washed) for each shower. DO NOT sleep with pets once you start using the CHG.   CHG Shower Instructions:  Wash your face and private area with normal soap. If you choose to wash your hair, wash first with your normal shampoo.  After you use shampoo/soap, rinse your hair and body thoroughly to remove shampoo/soap residue.  Turn the water OFF and apply  about 3 tablespoons (45 ml) of CHG soap to a CLEAN washcloth.  Apply CHG soap ONLY FROM YOUR NECK DOWN TO YOUR TOES (washing for 3-5 minutes)  DO NOT use CHG soap on face, private areas, open wounds, or sores.  Pay special attention to the area where your surgery is being performed.  If you are having back surgery, having someone wash your back for you may be helpful. Wait 2 minutes after CHG soap is applied, then you may rinse off the CHG soap.  Pat dry with a clean towel  Put on clean clothes/pajamas   If you choose to wear lotion, please use ONLY the CHG-compatible lotions that are listed below.  Additional instructions for the day of surgery: DO NOT APPLY any lotions, deodorants or cologne.   Do not bring valuables to the hospital. [facility] is not responsible for any belongings/valuables. Do not wear jewelry  Put on clean/comfortable clothes.  Please brush your teeth.  Ask your nurse before applying any prescription medications to the skin.     CHG Compatible Lotions   Aveeno Moisturizing lotion  Cetaphil Moisturizing Cream  Cetaphil Moisturizing Lotion  Clairol Herbal Essence Moisturizing Lotion, Dry Skin  Clairol Herbal Essence Moisturizing Lotion, Extra Dry Skin  Clairol Herbal Essence Moisturizing Lotion, Normal Skin  Curel Age Defying Therapeutic Moisturizing Lotion with Alpha Hydroxy  Curel Extreme Care Body Lotion  Curel Soothing Hands Moisturizing Hand Lotion  Curel Therapeutic Moisturizing Cream, Fragrance-Free  Curel Therapeutic Moisturizing Lotion, Fragrance-Free  Curel Therapeutic Moisturizing Lotion, Original Formula  Eucerin Daily Replenishing Lotion  Eucerin Dry Skin Therapy Plus Alpha Hydroxy Crme  Eucerin Dry Skin Therapy Plus Alpha Hydroxy Lotion  Eucerin Original Crme  Eucerin Original Lotion  Eucerin Plus Crme Eucerin Plus Lotion  Eucerin TriLipid Replenishing Lotion  Keri Anti-Bacterial Hand Lotion  Keri Deep Conditioning Original Lotion Dry  Skin Formula Softly Scented  Keri Deep Conditioning Original Lotion, Fragrance Free Sensitive Skin Formula  Keri Lotion Fast Absorbing Fragrance Free Sensitive Skin Formula  Keri Lotion Fast Absorbing Softly Scented Dry Skin Formula  Keri Original Lotion  Keri Skin Renewal Lotion Keri Silky Smooth Lotion  Keri Silky Smooth Sensitive Skin Lotion  Nivea Body Creamy Conditioning Oil  Nivea Body Extra Enriched Lotion  Nivea Body Original Lotion  Nivea Body Sheer Moisturizing Lotion Nivea Crme  Nivea Skin Firming Lotion  NutraDerm 30 Skin Lotion  NutraDerm Skin Lotion  NutraDerm Therapeutic Skin Cream  NutraDerm Therapeutic Skin Lotion  ProShield Protective Hand Cream  Provon moisturizing lotion  Please read over the following fact sheets that you were given.

## 2024-04-22 ENCOUNTER — Inpatient Hospital Stay (HOSPITAL_COMMUNITY)
Admission: RE | Admit: 2024-04-22 | Discharge: 2024-04-22 | Disposition: A | Discharge status: Home / Self Care | Source: Ambulatory Visit

## 2024-04-27 ENCOUNTER — Encounter (HOSPITAL_COMMUNITY)
Admission: RE | Admit: 2024-04-27 | Discharge: 2024-04-27 | Disposition: A | Discharge status: Home / Self Care | Source: Ambulatory Visit | Attending: Orthopedic Surgery | Admitting: Orthopedic Surgery

## 2024-04-27 ENCOUNTER — Other Ambulatory Visit: Payer: Self-pay

## 2024-04-27 ENCOUNTER — Other Ambulatory Visit (HOSPITAL_COMMUNITY)

## 2024-04-27 ENCOUNTER — Encounter (HOSPITAL_COMMUNITY): Payer: Self-pay

## 2024-04-27 VITALS — BP 130/91 | HR 64 | Temp 97.7°F | Resp 18 | Ht 76.0 in | Wt 253.0 lb

## 2024-04-27 DIAGNOSIS — Z01812 Encounter for preprocedural laboratory examination: Secondary | ICD-10-CM | POA: Diagnosis not present

## 2024-04-27 DIAGNOSIS — Z01818 Encounter for other preprocedural examination: Secondary | ICD-10-CM | POA: Diagnosis present

## 2024-04-27 HISTORY — DX: Personal history of urinary calculi: Z87.442

## 2024-04-27 LAB — CBC
HCT: 51.5 % (ref 39.0–52.0)
Hemoglobin: 18.1 g/dL — ABNORMAL HIGH (ref 13.0–17.0)
MCH: 34.2 pg — ABNORMAL HIGH (ref 26.0–34.0)
MCHC: 35.1 g/dL (ref 30.0–36.0)
MCV: 97.2 fL (ref 80.0–100.0)
Platelets: 221 K/uL (ref 150–400)
RBC: 5.3 MIL/uL (ref 4.22–5.81)
RDW: 13.2 % (ref 11.5–15.5)
WBC: 4.6 K/uL (ref 4.0–10.5)
nRBC: 0 % (ref 0.0–0.2)

## 2024-04-27 LAB — BASIC METABOLIC PANEL WITH GFR
Anion gap: 9 (ref 5–15)
BUN: 25 mg/dL — ABNORMAL HIGH (ref 6–20)
CO2: 23 mmol/L (ref 22–32)
Calcium: 9 mg/dL (ref 8.9–10.3)
Chloride: 104 mmol/L (ref 98–111)
Creatinine, Ser: 0.96 mg/dL (ref 0.61–1.24)
GFR, Estimated: >60 mL/min (ref >60)
Glucose, Bld: 96 mg/dL (ref 70–99)
Potassium: 4.7 mmol/L (ref 3.5–5.1)
Sodium: 136 mmol/L (ref 135–145)

## 2024-04-27 LAB — TYPE AND SCREEN
ABO/RH(D): O POS
Antibody Screen: NEGATIVE

## 2024-04-27 LAB — SURGICAL PCR SCREEN
MRSA, PCR: NEGATIVE
Staphylococcus aureus: POSITIVE — AB

## 2024-04-27 NOTE — Progress Notes (Signed)
 Surgical Instructions   Your procedure is scheduled on Monday May 02, 2024. Report to [facility] Main Entrance A at 5:30 A.M., then check in with the Admitting office. Any questions or running late day of surgery: call [phone]  Questions prior to your surgery date: call [phone], Monday-Friday, 8am-4pm. If you experience any cold or flu symptoms such as cough, fever, chills, shortness of breath, etc. between now and your scheduled surgery, please notify us  at the above number.     Remember:  Do not eat after midnight the night before your surgery  You may drink clear liquids until 4:30 the morning of your surgery.   Clear liquids allowed are: Water, Non-Citrus Juices (without pulp), Carbonated Beverages, Clear Tea (no milk, honey, etc.), Black Coffee Only (NO MILK, CREAM OR POWDERED CREAMER of any kind), and Gatorade.    Take these medicines if needed the morning of surgery with A SIP OF WATER  Azelastine-Fluticasone Nasal Spray   One week prior to surgery, STOP taking any Aspirin (unless otherwise instructed by your surgeon) Aleve, Naproxen, Ibuprofen, Motrin, Advil, Goody's, BC's, all herbal medications, fish oil, and non-prescription vitamins.                     Do NOT Smoke (Tobacco/Vaping) for 24 hours prior to your procedure.  If you use a CPAP at night, you may bring your mask/headgear for your overnight stay.   You will be asked to remove any contacts, glasses, piercing's, hearing aid's, dentures/partials prior to surgery. Please bring cases for these items if needed.    Patients discharged the day of surgery will not be allowed to drive home, and someone needs to stay with them for 24 hours.  SURGICAL WAITING ROOM VISITATION Patients may have no more than 2 support people in the waiting area - these visitors may rotate.   Pre-op nurse will coordinate an appropriate time for 1 ADULT support person, who may not rotate, to accompany patient in pre-op.  Children  under the age of 64 must have an adult with them who is not the patient and must remain in the main waiting area with an adult.  If the patient needs to stay at the hospital during part of their recovery, the visitor guidelines for inpatient rooms apply.  Please refer to the [facility] website for the visitor guidelines for any additional information.   If you received a COVID test during your pre-op visit  it is requested that you wear a mask when out in public, stay away from anyone that may not be feeling well and notify your surgeon if you develop symptoms. If you have been in contact with anyone that has tested positive in the last 10 days please notify you surgeon.      Pre-operative 5 CHG Bathing Instructions   You can play a key role in reducing the risk of infection after surgery. Your skin needs to be as free of germs as possible. You can reduce the number of germs on your skin by washing with CHG (chlorhexidine gluconate) soap before surgery. CHG is an antiseptic soap that kills germs and continues to kill germs even after washing.   DO NOT use if you have an allergy to chlorhexidine/CHG or antibacterial soaps. If your skin becomes reddened or irritated, stop using the CHG and notify one of our RNs at [phone].   Please shower with the CHG soap starting 4 days before surgery using the following schedule:  Please keep in mind the following:  You may shave your face at any point before/day of surgery.  Place clean sheets on your bed the day you start using CHG soap. Use a clean washcloth (not used since being washed) for each shower. DO NOT sleep with pets once you start using the CHG.   CHG Shower Instructions:  Wash your face and private area with normal soap. If you choose to wash your hair, wash first with your normal shampoo.  After you use shampoo/soap, rinse your hair and body thoroughly to remove shampoo/soap residue.  Turn the water OFF and apply about 3  tablespoons (45 ml) of CHG soap to a CLEAN washcloth.  Apply CHG soap ONLY FROM YOUR NECK DOWN TO YOUR TOES (washing for 3-5 minutes)  DO NOT use CHG soap on face, private areas, open wounds, or sores.  Pay special attention to the area where your surgery is being performed.  If you are having back surgery, having someone wash your back for you may be helpful. Wait 2 minutes after CHG soap is applied, then you may rinse off the CHG soap.  Pat dry with a clean towel  Put on clean clothes/pajamas   If you choose to wear lotion, please use ONLY the CHG-compatible lotions that are listed below.  Additional instructions for the day of surgery: DO NOT APPLY any lotions, deodorants or cologne.   Do not bring valuables to the hospital. [facility] is not responsible for any belongings/valuables. Do not wear jewelry  Put on clean/comfortable clothes.  Please brush your teeth.  Ask your nurse before applying any prescription medications to the skin.     CHG Compatible Lotions   Aveeno Moisturizing lotion  Cetaphil Moisturizing Cream  Cetaphil Moisturizing Lotion  Clairol Herbal Essence Moisturizing Lotion, Dry Skin  Clairol Herbal Essence Moisturizing Lotion, Extra Dry Skin  Clairol Herbal Essence Moisturizing Lotion, Normal Skin  Curel Age Defying Therapeutic Moisturizing Lotion with Alpha Hydroxy  Curel Extreme Care Body Lotion  Curel Soothing Hands Moisturizing Hand Lotion  Curel Therapeutic Moisturizing Cream, Fragrance-Free  Curel Therapeutic Moisturizing Lotion, Fragrance-Free  Curel Therapeutic Moisturizing Lotion, Original Formula  Eucerin Daily Replenishing Lotion  Eucerin Dry Skin Therapy Plus Alpha Hydroxy Crme  Eucerin Dry Skin Therapy Plus Alpha Hydroxy Lotion  Eucerin Original Crme  Eucerin Original Lotion  Eucerin Plus Crme Eucerin Plus Lotion  Eucerin TriLipid Replenishing Lotion  Keri Anti-Bacterial Hand Lotion  Keri Deep Conditioning Original Lotion Dry Skin  Formula Softly Scented  Keri Deep Conditioning Original Lotion, Fragrance Free Sensitive Skin Formula  Keri Lotion Fast Absorbing Fragrance Free Sensitive Skin Formula  Keri Lotion Fast Absorbing Softly Scented Dry Skin Formula  Keri Original Lotion  Keri Skin Renewal Lotion Keri Silky Smooth Lotion  Keri Silky Smooth Sensitive Skin Lotion  Nivea Body Creamy Conditioning Oil  Nivea Body Extra Enriched Lotion  Nivea Body Original Lotion  Nivea Body Sheer Moisturizing Lotion Nivea Crme  Nivea Skin Firming Lotion  NutraDerm 30 Skin Lotion  NutraDerm Skin Lotion  NutraDerm Therapeutic Skin Cream  NutraDerm Therapeutic Skin Lotion  ProShield Protective Hand Cream  Provon moisturizing lotion  Please read over the following fact sheets that you were given.

## 2024-04-27 NOTE — Progress Notes (Signed)
 PCP - Dr. Bernardino Pinal with [facility] Cardiologist - Denies  PPM/ICD - Denies Device Orders - n/a Rep Notified - n/a  Chest x-ray - n/a EKG - Earlier this month with PCP. Tracing requested Stress Test - Denies ECHO - Denies  Cardiac Cath - Denies  Sleep Study - +OSA (very mild case per pt). Pt wears oral device nightly.  No DM  Last dose of GLP1 agonist- n/a GLP1 instructions: n/a   Blood Thinner Instructions: n/a Aspirin Instructions: n/a  ERAS Protcol - Clear liquids until 0430 morning of surgery PRE-SURGERY Ensure or G2- n/a  COVID TEST- n/a   Anesthesia review: Yes. Records requested from PCP   Patient denies shortness of breath, fever, cough and chest pain at PAT appointment. Pt denies any respiratory illness/infection in the last two months.    All instructions explained to the patient, with a verbal understanding of the material. Patient agrees to go over the instructions while at home for a better understanding. Patient also instructed to self quarantine after being tested for COVID-19. The opportunity to ask questions was provided.

## 2024-05-01 NOTE — Anesthesia Preprocedure Evaluation (Signed)
 Anesthesia Evaluation  Patient identified by MRN, date of birth, ID band Patient awake    Reviewed: Allergy & Precautions, NPO status , Patient's Chart, lab work & pertinent test results  History of Anesthesia Complications (+) PONV and history of anesthetic complications  Airway Mallampati: I  TM Distance: >3 FB Neck ROM: Full    Dental no notable dental hx. (+) Caps, Implants, Dental Advisory Given, Teeth Intact   Pulmonary sleep apnea    Pulmonary exam normal breath sounds clear to auscultation       Cardiovascular negative cardio ROS Normal cardiovascular exam Rhythm:Regular Rate:Normal     Neuro/Psych negative neurological ROS  negative psych ROS   GI/Hepatic negative GI ROS,,,  Endo/Other  negative endocrine ROS    Renal/GU Lab Results      Component                Value               Date                             K                        4.7                 04/27/2024                CO2                      23                  04/27/2024                BUN                      25 (H)              04/27/2024                CREATININE               0.96                04/27/2024                GFRNONAA                 >60                 04/27/2024                     Musculoskeletal  (+) Arthritis ,    Abdominal   Peds  Hematology negative hematology ROS (+) Lab Results      Component                Value               Date                      WBC                      4.6                 04/27/2024                HGB  18.1 (H)            04/27/2024                HCT                      51.5                04/27/2024                MCV                      97.2                04/27/2024                PLT                      221                 04/27/2024              Anesthesia Other Findings All: allegra, hydromorphone, ivp dye  Reproductive/Obstetrics                               Anesthesia Physical Anesthesia Plan  ASA: 2  Anesthesia Plan: General   Post-op Pain Management: Ketamine  IV*, Lidocaine  infusion*, Ofirmev  IV (intra-op)* and Precedex    Induction: Intravenous  PONV Risk Score and Plan: 4 or greater and Treatment may vary due to age or medical condition, Ondansetron , Dexamethasone , Midazolam  and Scopolamine  patch - Pre-op  Airway Management Planned: Oral ETT  Additional Equipment: None  Intra-op Plan:   Post-operative Plan: Extubation in OR  Informed Consent:      Dental advisory given  Plan Discussed with: CRNA and Surgeon  Anesthesia Plan Comments:          Anesthesia Quick Evaluation

## 2024-05-02 ENCOUNTER — Other Ambulatory Visit: Payer: Self-pay

## 2024-05-02 ENCOUNTER — Inpatient Hospital Stay (HOSPITAL_COMMUNITY)
Admission: RE | Admit: 2024-05-02 | Discharge: 2024-05-03 | DRG: 428 | Disposition: A | Discharge status: Home / Self Care | Attending: Orthopedic Surgery | Admitting: Orthopedic Surgery

## 2024-05-02 ENCOUNTER — Encounter (HOSPITAL_COMMUNITY): Payer: Self-pay | Admitting: Orthopedic Surgery

## 2024-05-02 ENCOUNTER — Inpatient Hospital Stay (HOSPITAL_COMMUNITY): Payer: Self-pay | Admitting: Physician Assistant

## 2024-05-02 ENCOUNTER — Inpatient Hospital Stay (HOSPITAL_COMMUNITY): Payer: Self-pay | Admitting: Anesthesiology

## 2024-05-02 ENCOUNTER — Encounter (HOSPITAL_COMMUNITY)
Admission: RE | Disposition: A | Discharge status: Home / Self Care | Payer: Self-pay | Source: Home / Self Care | Attending: Orthopedic Surgery

## 2024-05-02 ENCOUNTER — Inpatient Hospital Stay (HOSPITAL_COMMUNITY)

## 2024-05-02 DIAGNOSIS — M4316 Spondylolisthesis, lumbar region: Secondary | ICD-10-CM | POA: Diagnosis present

## 2024-05-02 DIAGNOSIS — M48061 Spinal stenosis, lumbar region without neurogenic claudication: Secondary | ICD-10-CM | POA: Diagnosis present

## 2024-05-02 DIAGNOSIS — M5127 Other intervertebral disc displacement, lumbosacral region: Secondary | ICD-10-CM | POA: Diagnosis present

## 2024-05-02 DIAGNOSIS — M47816 Spondylosis without myelopathy or radiculopathy, lumbar region: Secondary | ICD-10-CM | POA: Diagnosis present

## 2024-05-02 DIAGNOSIS — M5116 Intervertebral disc disorders with radiculopathy, lumbar region: Secondary | ICD-10-CM

## 2024-05-02 DIAGNOSIS — M961 Postlaminectomy syndrome, not elsewhere classified: Secondary | ICD-10-CM | POA: Diagnosis present

## 2024-05-02 DIAGNOSIS — Z981 Arthrodesis status: Principal | ICD-10-CM

## 2024-05-02 DIAGNOSIS — Z8614 Personal history of Methicillin resistant Staphylococcus aureus infection: Secondary | ICD-10-CM | POA: Diagnosis not present

## 2024-05-02 DIAGNOSIS — Z87442 Personal history of urinary calculi: Secondary | ICD-10-CM | POA: Diagnosis not present

## 2024-05-02 DIAGNOSIS — M51379 Other intervertebral disc degeneration, lumbosacral region without mention of lumbar back pain or lower extremity pain: Secondary | ICD-10-CM | POA: Diagnosis present

## 2024-05-02 HISTORY — PX: TRANSFORAMINAL LUMBAR INTERBODY FUSION (TLIF) WITH PEDICLE SCREW FIXATION 1 LEVEL: SHX6141

## 2024-05-02 SURGERY — TRANSFORAMINAL LUMBAR INTERBODY FUSION (TLIF) WITH PEDICLE SCREW FIXATION 1 LEVEL
Anesthesia: General

## 2024-05-02 MED ORDER — ONDANSETRON HCL 4 MG/2ML IJ SOLN
INTRAMUSCULAR | Status: DC | PRN
  Administered 2024-05-02: 4 mg via INTRAVENOUS

## 2024-05-02 MED ORDER — FENTANYL CITRATE (PF) 250 MCG/5ML IJ SOLN
INTRAMUSCULAR | Status: AC
  Filled 2024-05-02: qty 5

## 2024-05-02 MED ORDER — DEXMEDETOMIDINE HCL IN NACL 80 MCG/20ML IV SOLN
INTRAVENOUS | Status: AC
Start: 2024-05-02 — End: 2024-05-02
  Filled 2024-05-02: qty 20

## 2024-05-02 MED ORDER — DEXMEDETOMIDINE HCL IN NACL 80 MCG/20ML IV SOLN
INTRAVENOUS | Status: DC | PRN
Start: 2024-05-02 — End: 2024-05-02
  Administered 2024-05-02: 10 ug via INTRAVENOUS

## 2024-05-02 MED ORDER — AMISULPRIDE (ANTIEMETIC) 5 MG/2ML IV SOLN: 10.0000 mg | Freq: Once | INTRAVENOUS | Status: DC | PRN

## 2024-05-02 MED ORDER — CEFAZOLIN SODIUM-DEXTROSE 2-4 GM/100ML-% IV SOLN
2.0000 g | INTRAVENOUS | Status: AC
  Administered 2024-05-02 (×2): 2 g via INTRAVENOUS
  Filled 2024-05-02: qty 100

## 2024-05-02 MED ORDER — PHENOL 1.4 % MT LIQD: 1.0000 | OROMUCOSAL | Status: DC | PRN

## 2024-05-02 MED ORDER — METOCLOPRAMIDE HCL 5 MG/ML IJ SOLN
INTRAMUSCULAR | Status: AC
  Filled 2024-05-02: qty 2

## 2024-05-02 MED ORDER — ACETAMINOPHEN 10 MG/ML IV SOLN
INTRAVENOUS | Status: DC | PRN
  Administered 2024-05-02: 1000 mg via INTRAVENOUS

## 2024-05-02 MED ORDER — MENTHOL 3 MG MT LOZG: 1.0000 | LOZENGE | OROMUCOSAL | Status: DC | PRN

## 2024-05-02 MED ORDER — PHENYLEPHRINE 80 MCG/ML (10ML) SYRINGE FOR IV PUSH (FOR BLOOD PRESSURE SUPPORT)
PREFILLED_SYRINGE | INTRAVENOUS | Status: AC
Start: 2024-05-02 — End: 2024-05-02
  Filled 2024-05-02: qty 10

## 2024-05-02 MED ORDER — SUCCINYLCHOLINE CHLORIDE 200 MG/10ML IV SOSY
PREFILLED_SYRINGE | INTRAVENOUS | Status: DC | PRN
Start: 2024-05-02 — End: 2024-05-02
  Administered 2024-05-02: 120 mg via INTRAVENOUS

## 2024-05-02 MED ORDER — KETAMINE HCL 50 MG/5ML IJ SOSY
PREFILLED_SYRINGE | INTRAMUSCULAR | Status: DC | PRN
  Administered 2024-05-02: 30 mg via INTRAVENOUS
  Administered 2024-05-02: 20 mg via INTRAVENOUS

## 2024-05-02 MED ORDER — PHENYLEPHRINE HCL-NACL 20-0.9 MG/250ML-% IV SOLN
INTRAVENOUS | Status: DC | PRN
  Administered 2024-05-02: 20 ug/min via INTRAVENOUS

## 2024-05-02 MED ORDER — ACETAMINOPHEN 10 MG/ML IV SOLN
INTRAVENOUS | Status: AC
  Filled 2024-05-02: qty 100

## 2024-05-02 MED ORDER — PROPOFOL 10 MG/ML IV BOLUS
INTRAVENOUS | Status: AC
  Filled 2024-05-02: qty 20

## 2024-05-02 MED ORDER — 0.9 % SODIUM CHLORIDE (POUR BTL) OPTIME
TOPICAL | Status: DC | PRN
  Administered 2024-05-02: 2000 mL

## 2024-05-02 MED ORDER — ONDANSETRON HCL 4 MG/2ML IJ SOLN
INTRAMUSCULAR | Status: AC
  Filled 2024-05-02: qty 2

## 2024-05-02 MED ORDER — OXYCODONE-ACETAMINOPHEN 10-325 MG PO TABS: 1.0000 | ORAL_TABLET | Freq: Four times a day (QID) | ORAL | 0 refills | Status: DC | PRN

## 2024-05-02 MED ORDER — OXYCODONE HCL 5 MG PO TABS
5.0000 mg | ORAL_TABLET | ORAL | Status: DC | PRN
  Administered 2024-05-02 – 2024-05-03 (×3): 5 mg via ORAL
  Filled 2024-05-02 (×2): qty 1

## 2024-05-02 MED ORDER — ORAL CARE MOUTH RINSE: 15.0000 mL | Freq: Once | OROMUCOSAL | Status: AC

## 2024-05-02 MED ORDER — KETAMINE HCL 50 MG/5ML IJ SOSY
PREFILLED_SYRINGE | INTRAMUSCULAR | Status: AC
Start: 2024-05-02 — End: 2024-05-02
  Filled 2024-05-02: qty 5

## 2024-05-02 MED ORDER — DEXAMETHASONE SODIUM PHOSPHATE 10 MG/ML IJ SOLN
INTRAMUSCULAR | Status: DC | PRN
Start: 2024-05-02 — End: 2024-05-02
  Administered 2024-05-02: 10 mg via INTRAVENOUS

## 2024-05-02 MED ORDER — POLYETHYLENE GLYCOL 3350 17 G PO PACK: 17.0000 g | PACK | Freq: Every day | ORAL | Status: DC | PRN

## 2024-05-02 MED ORDER — LACTATED RINGERS IV SOLN: INTRAVENOUS | Status: DC

## 2024-05-02 MED ORDER — CEFAZOLIN SODIUM 1 G IJ SOLR
INTRAMUSCULAR | Status: AC
  Filled 2024-05-02: qty 20

## 2024-05-02 MED ORDER — LIDOCAINE IN D5W 4-5 MG/ML-% IV SOLN
INTRAVENOUS | Status: DC | PRN
  Administered 2024-05-02: 22.046 ug/kg/min via INTRAVENOUS

## 2024-05-02 MED ORDER — OXYCODONE HCL 5 MG/5ML PO SOLN
5.0000 mg | Freq: Once | ORAL | Status: AC | PRN
  Administered 2024-05-02: 5 mg via ORAL

## 2024-05-02 MED ORDER — MIDAZOLAM HCL 2 MG/2ML IJ SOLN
INTRAMUSCULAR | Status: AC
  Filled 2024-05-02: qty 2

## 2024-05-02 MED ORDER — CHLORHEXIDINE GLUCONATE 0.12 % MT SOLN
15.0000 mL | Freq: Once | OROMUCOSAL | Status: AC
  Administered 2024-05-02: 15 mL via OROMUCOSAL

## 2024-05-02 MED ORDER — DEXAMETHASONE 4 MG PO TABS
4.0000 mg | ORAL_TABLET | Freq: Four times a day (QID) | ORAL | Status: DC
  Administered 2024-05-02 – 2024-05-03 (×2): 4 mg via ORAL
  Filled 2024-05-02 (×2): qty 1

## 2024-05-02 MED ORDER — ONDANSETRON HCL 4 MG PO TABS: 4.0000 mg | ORAL_TABLET | Freq: Three times a day (TID) | ORAL | 0 refills | Status: DC | PRN

## 2024-05-02 MED ORDER — DEXAMETHASONE SODIUM PHOSPHATE 10 MG/ML IJ SOLN
INTRAMUSCULAR | Status: AC
Start: 2024-05-02 — End: 2024-05-02
  Filled 2024-05-02: qty 1

## 2024-05-02 MED ORDER — MORPHINE SULFATE (PF) 4 MG/ML IV SOLN: 4.0000 mg | INTRAVENOUS | Status: DC | PRN

## 2024-05-02 MED ORDER — ROCURONIUM BROMIDE 10 MG/ML (PF) SYRINGE
PREFILLED_SYRINGE | INTRAVENOUS | Status: AC
  Filled 2024-05-02: qty 10

## 2024-05-02 MED ORDER — CEFAZOLIN SODIUM-DEXTROSE 1-4 GM/50ML-% IV SOLN
1.0000 g | Freq: Three times a day (TID) | INTRAVENOUS | Status: AC
  Administered 2024-05-02 – 2024-05-03 (×2): 1 g via INTRAVENOUS
  Filled 2024-05-02 (×2): qty 50

## 2024-05-02 MED ORDER — THROMBIN 20000 UNITS EX SOLR
CUTANEOUS | Status: AC
  Filled 2024-05-02: qty 20000

## 2024-05-02 MED ORDER — MIDAZOLAM HCL 2 MG/2ML IJ SOLN
INTRAMUSCULAR | Status: AC
Start: 2024-05-02 — End: 2024-05-02
  Filled 2024-05-02: qty 2

## 2024-05-02 MED ORDER — ACETAMINOPHEN 325 MG PO TABS: 650.0000 mg | ORAL_TABLET | ORAL | Status: DC | PRN

## 2024-05-02 MED ORDER — METHOCARBAMOL 1000 MG/10ML IJ SOLN
500.0000 mg | Freq: Four times a day (QID) | INTRAMUSCULAR | Status: DC | PRN
Start: 2024-05-02 — End: 2024-05-03

## 2024-05-02 MED ORDER — ONDANSETRON HCL 4 MG PO TABS: 4.0000 mg | ORAL_TABLET | Freq: Four times a day (QID) | ORAL | Status: DC | PRN

## 2024-05-02 MED ORDER — BUPIVACAINE-EPINEPHRINE 0.25% -1:200000 IJ SOLN
INTRAMUSCULAR | Status: DC | PRN
  Administered 2024-05-02: 10 mL

## 2024-05-02 MED ORDER — MAGNESIUM CITRATE PO SOLN
1.0000 | Freq: Once | ORAL | Status: AC | PRN
  Administered 2024-05-02: 1 via ORAL
  Filled 2024-05-02: qty 296

## 2024-05-02 MED ORDER — MIDAZOLAM HCL 2 MG/2ML IJ SOLN
INTRAMUSCULAR | Status: DC | PRN
Start: 2024-05-02 — End: 2024-05-02
  Administered 2024-05-02: 4 mg via INTRAVENOUS

## 2024-05-02 MED ORDER — PROPOFOL 500 MG/50ML IV EMUL
INTRAVENOUS | Status: DC | PRN
  Administered 2024-05-02: 125 ug/kg/min via INTRAVENOUS
  Administered 2024-05-02: 200 mg via INTRAVENOUS

## 2024-05-02 MED ORDER — BUPIVACAINE-EPINEPHRINE (PF) 0.25% -1:200000 IJ SOLN
INTRAMUSCULAR | Status: AC
  Filled 2024-05-02: qty 30

## 2024-05-02 MED ORDER — ACETAMINOPHEN 10 MG/ML IV SOLN: 1000.0000 mg | Freq: Once | INTRAVENOUS | Status: DC | PRN

## 2024-05-02 MED ORDER — SODIUM CHLORIDE 0.9 % IV SOLN: 250.0000 mL | INTRAVENOUS | Status: DC

## 2024-05-02 MED ORDER — SODIUM CHLORIDE 0.9 % IV SOLN: INTRAVENOUS | Status: DC | PRN

## 2024-05-02 MED ORDER — OXYCODONE HCL 5 MG PO TABS
10.0000 mg | ORAL_TABLET | ORAL | Status: DC | PRN
  Filled 2024-05-02: qty 2

## 2024-05-02 MED ORDER — SURGIFLO WITH THROMBIN (HEMOSTATIC MATRIX KIT) OPTIME
TOPICAL | Status: DC | PRN
Start: 2024-05-02 — End: 2024-05-02
  Administered 2024-05-02: 1

## 2024-05-02 MED ORDER — OXYCODONE HCL 5 MG/5ML PO SOLN
ORAL | Status: AC
  Filled 2024-05-02: qty 5

## 2024-05-02 MED ORDER — LIDOCAINE 2% (20 MG/ML) 5 ML SYRINGE
INTRAMUSCULAR | Status: DC | PRN
  Administered 2024-05-02: 100 mg via INTRAVENOUS

## 2024-05-02 MED ORDER — SODIUM CHLORIDE 0.9% FLUSH
3.0000 mL | INTRAVENOUS | Status: DC | PRN
Start: 2024-05-02 — End: 2024-05-03

## 2024-05-02 MED ORDER — DEXAMETHASONE SODIUM PHOSPHATE 4 MG/ML IJ SOLN
4.0000 mg | Freq: Four times a day (QID) | INTRAMUSCULAR | Status: DC
  Administered 2024-05-02: 4 mg via INTRAVENOUS
  Filled 2024-05-02: qty 1

## 2024-05-02 MED ORDER — SODIUM CHLORIDE (PF) 0.9 % IJ SOLN
INTRAMUSCULAR | Status: AC
  Filled 2024-05-02: qty 10

## 2024-05-02 MED ORDER — ACETAMINOPHEN 650 MG RE SUPP: 650.0000 mg | RECTAL | Status: DC | PRN

## 2024-05-02 MED ORDER — LIDOCAINE 2% (20 MG/ML) 5 ML SYRINGE
INTRAMUSCULAR | Status: AC
  Filled 2024-05-02: qty 5

## 2024-05-02 MED ORDER — THROMBIN 20000 UNITS EX SOLR
CUTANEOUS | Status: DC | PRN
  Administered 2024-05-02: 20 mL

## 2024-05-02 MED ORDER — METOCLOPRAMIDE HCL 5 MG/ML IJ SOLN
INTRAMUSCULAR | Status: DC | PRN
Start: 2024-05-02 — End: 2024-05-02
  Administered 2024-05-02: 10 mg via INTRAVENOUS

## 2024-05-02 MED ORDER — FENTANYL CITRATE (PF) 100 MCG/2ML IJ SOLN: 25.0000 ug | INTRAMUSCULAR | Status: DC | PRN

## 2024-05-02 MED ORDER — METHOCARBAMOL 500 MG PO TABS
500.0000 mg | ORAL_TABLET | Freq: Four times a day (QID) | ORAL | Status: DC | PRN
  Filled 2024-05-02: qty 1

## 2024-05-02 MED ORDER — ONDANSETRON HCL 4 MG/2ML IJ SOLN: 4.0000 mg | Freq: Once | INTRAMUSCULAR | Status: DC | PRN

## 2024-05-02 MED ORDER — SODIUM CHLORIDE 0.9% FLUSH
3.0000 mL | Freq: Two times a day (BID) | INTRAVENOUS | Status: DC
  Administered 2024-05-02: 3 mL via INTRAVENOUS

## 2024-05-02 MED ORDER — SCOPOLAMINE 1 MG/3DAYS TD PT72
1.0000 | MEDICATED_PATCH | TRANSDERMAL | Status: DC
  Administered 2024-05-02: 1.5 mg via TRANSDERMAL
  Filled 2024-05-02: qty 1

## 2024-05-02 MED ORDER — TRANEXAMIC ACID-NACL 1000-0.7 MG/100ML-% IV SOLN
1000.0000 mg | INTRAVENOUS | Status: AC
  Administered 2024-05-02: 1000 mg via INTRAVENOUS
  Filled 2024-05-02: qty 100

## 2024-05-02 MED ORDER — LIDOCAINE IN D5W 4-5 MG/ML-% IV SOLN
INTRAVENOUS | Status: AC
  Filled 2024-05-02: qty 500

## 2024-05-02 MED ORDER — SUCCINYLCHOLINE CHLORIDE 200 MG/10ML IV SOSY
PREFILLED_SYRINGE | INTRAVENOUS | Status: AC
  Filled 2024-05-02: qty 10

## 2024-05-02 MED ORDER — TAMSULOSIN HCL 0.4 MG PO CAPS: 0.4000 mg | ORAL_CAPSULE | Freq: Every day | ORAL | Status: DC

## 2024-05-02 MED ORDER — CHLORHEXIDINE GLUCONATE 0.12 % MT SOLN
15.0000 mL | Freq: Once | OROMUCOSAL | Status: AC
  Filled 2024-05-02: qty 15

## 2024-05-02 MED ORDER — PROPOFOL 1000 MG/100ML IV EMUL
INTRAVENOUS | Status: AC
Start: 2024-05-02 — End: 2024-05-02
  Filled 2024-05-02: qty 100

## 2024-05-02 MED ORDER — OXYCODONE HCL 5 MG PO TABS: 5.0000 mg | ORAL_TABLET | Freq: Once | ORAL | Status: AC | PRN

## 2024-05-02 MED ORDER — ONDANSETRON HCL 4 MG/2ML IJ SOLN: 4.0000 mg | Freq: Four times a day (QID) | INTRAMUSCULAR | Status: DC | PRN

## 2024-05-02 MED ORDER — FENTANYL CITRATE (PF) 250 MCG/5ML IJ SOLN
INTRAMUSCULAR | Status: DC | PRN
  Administered 2024-05-02 (×2): 50 ug via INTRAVENOUS
  Administered 2024-05-02: 150 ug via INTRAVENOUS

## 2024-05-02 MED ORDER — METHOCARBAMOL 500 MG PO TABS: 500.0000 mg | ORAL_TABLET | Freq: Three times a day (TID) | ORAL | 0 refills | Status: DC | PRN

## 2024-05-02 SURGICAL SUPPLY — 67 items
BAG COUNTER SPONGE SURGICOUNT (BAG) ×1 IMPLANT
BLADE BONE MILL MEDIUM (MISCELLANEOUS) IMPLANT
BLADE CLIPPER SURG (BLADE) IMPLANT
BUR EGG ELITE 4.0 (BURR) ×1 IMPLANT
BUR MATCHSTICK NEURO 3.0 LAGG (BURR) ×1 IMPLANT
CAGE MOD EX PL 7X9X24 17D (Cage) IMPLANT
CANISTER SUCTION 3000ML PPV (SUCTIONS) ×1 IMPLANT
CAP RELINE MOD TULIP RMM (Cap) IMPLANT
CLSR STERI-STRIP ANTIMIC 1/2X4 (GAUZE/BANDAGES/DRESSINGS) ×1 IMPLANT
COVER SURGICAL LIGHT HANDLE (MISCELLANEOUS) ×1 IMPLANT
DRAPE C-ARM 42X72 X-RAY (DRAPES) ×1 IMPLANT
DRAPE C-ARMOR (DRAPES) ×1 IMPLANT
DRAPE POUCH INSTRU U-SHP 10X18 (DRAPES) IMPLANT
DRAPE SURG 17X23 STRL (DRAPES) ×1 IMPLANT
DRAPE U-SHAPE 47X51 STRL (DRAPES) ×1 IMPLANT
DRSG OPSITE POSTOP 4X6 (GAUZE/BANDAGES/DRESSINGS) ×1 IMPLANT
DRSG OPSITE POSTOP 4X8 (GAUZE/BANDAGES/DRESSINGS) ×1 IMPLANT
DURAPREP 26ML APPLICATOR (WOUND CARE) ×1 IMPLANT
ELECT BLADE 6.5 EXT (BLADE) ×1 IMPLANT
ELECT NVM5 SURFACE MEP/EMG (ELECTRODE) IMPLANT
ELECT PENCIL ROCKER SW 15FT (MISCELLANEOUS) ×1 IMPLANT
ELECTRODE BLDE 4.0 EZ CLN MEGD (MISCELLANEOUS) IMPLANT
ELECTRODE REM PT RTRN 9FT ADLT (ELECTROSURGICAL) ×1 IMPLANT
GLOVE BIOGEL PI IND STRL 8.5 (GLOVE) ×1 IMPLANT
GLOVE SS BIOGEL STRL SZ 8.5 (GLOVE) ×1 IMPLANT
GOWN STRL REUS W/TWL 2XL LVL3 (GOWN DISPOSABLE) ×1 IMPLANT
KIT BASIN OR (CUSTOM PROCEDURE TRAY) ×1 IMPLANT
KIT POSITIONER JACKSON TABLE (MISCELLANEOUS) IMPLANT
KIT TURNOVER KIT B (KITS) ×1 IMPLANT
MODULE EMG NDL SSEP NVM5 (NEUROSURGERY SUPPLIES) IMPLANT
MODULE EMG NEEDLE SSEP NVM5 (NEUROSURGERY SUPPLIES) ×1 IMPLANT
NDL 22X1.5 STRL (OR ONLY) (MISCELLANEOUS) IMPLANT
NDL SPNL 18GX3.5 QUINCKE PK (NEEDLE) ×1 IMPLANT
NEEDLE 22X1.5 STRL (OR ONLY) (MISCELLANEOUS) IMPLANT
NEEDLE SPNL 18GX3.5 QUINCKE PK (NEEDLE) ×1 IMPLANT
NS IRRIG 1000ML POUR BTL (IV SOLUTION) ×1 IMPLANT
PACK LAMINECTOMY ORTHO (CUSTOM PROCEDURE TRAY) ×1 IMPLANT
PACK UNIVERSAL I (CUSTOM PROCEDURE TRAY) ×1 IMPLANT
PAD ARMBOARD POSITIONER FOAM (MISCELLANEOUS) ×2 IMPLANT
PATTIES SURGICAL .5 X.5 (GAUZE/BANDAGES/DRESSINGS) IMPLANT
PATTIES SURGICAL .5 X1 (DISPOSABLE) ×1 IMPLANT
POSITIONER HEAD PRONE TRACH (MISCELLANEOUS) ×1 IMPLANT
PROBE BALL TIP NVM5 SNG USE (NEUROSURGERY SUPPLIES) IMPLANT
PUTTY DBM INSTAFILL CART 5CC (Putty) IMPLANT
ROD RELINE 5.0X45MM (Rod) IMPLANT
SCREW LOCK RSS 4.5/5.0MM (Screw) IMPLANT
SCREW SHANK RELINE-O 6.5X50 (Screw) IMPLANT
SHANK RELINE O MOD 6.5X45 (Screw) IMPLANT
SPONGE SURGIFOAM ABS GEL 100 (HEMOSTASIS) ×1 IMPLANT
SPONGE T-LAP 4X18 ~~LOC~~+RFID (SPONGE) ×3 IMPLANT
SURGIFLO W/THROMBIN 8M KIT (HEMOSTASIS) ×1 IMPLANT
SUT BONE WAX W31G (SUTURE) ×1 IMPLANT
SUT MNCRL AB 3-0 PS2 27 (SUTURE) ×2 IMPLANT
SUT MNCRL+ AB 3-0 CT1 36 (SUTURE) ×1 IMPLANT
SUT STRATAFIX 1PDS 45CM VIOLET (SUTURE) IMPLANT
SUT STRATAFIX PDS+ 0 24IN (SUTURE) IMPLANT
SUT VIC AB 0 CT1 27XBRD ANTBC (SUTURE) ×1 IMPLANT
SUT VIC AB 1 CT1 18XCR BRD 8 (SUTURE) ×1 IMPLANT
SUT VIC AB 2-0 CT1 18 (SUTURE) ×1 IMPLANT
SYR BULB IRRIG 60ML STRL (SYRINGE) ×1 IMPLANT
SYR CONTROL 10ML LL (SYRINGE) ×1 IMPLANT
TIP CONICAL INSTAFILL (ORTHOPEDIC DISPOSABLE SUPPLIES) IMPLANT
TOWEL GREEN STERILE (TOWEL DISPOSABLE) ×1 IMPLANT
TOWEL GREEN STERILE FF (TOWEL DISPOSABLE) ×1 IMPLANT
TRAY FOLEY MTR SLVR 16FR STAT (SET/KITS/TRAYS/PACK) ×1 IMPLANT
WATER STERILE IRR 1000ML POUR (IV SOLUTION) ×1 IMPLANT
YANKAUER SUCT BULB TIP NO VENT (SUCTIONS) ×1 IMPLANT

## 2024-05-02 NOTE — Discharge Instructions (Signed)

## 2024-05-02 NOTE — H&P (Signed)
 History:  The patient is a 58 year old male who presents for pre-operative visit in preparation for their TLIF L4-5, which is scheduled on 05/02/24 with Dr. Donaciano Sprang, MD at [facility]. The patient has had symptoms in the Back including pain which has impacted their quality of life and ability to do activities of daily living. The patient currently has a diagnosis of lumbar radiculopathy and has failed conservative treatments including activity modification. The patient has had previous discetomy . The patient denies an active infection.  Past Medical History:  Diagnosis Date   History of kidney stones    Hx MRSA infection 15 years ago   Pneumonia    age 58   PONV (postoperative nausea and vomiting)    during nasal surgery    Allergies  Allergen Reactions   Allegra-D [Fexofenadine-Pseudoephed Er] Hives   Hydromorphone Itching    Face and eyes itched   Ivp Dye [Iodinated Contrast Media] Hives    No current facility-administered medications on file prior to encounter.   Current Outpatient Medications on File Prior to Encounter  Medication Sig Dispense Refill   Azelastine-Fluticasone 137-50 MCG/ACT SUSP Place 1 spray into both nostrils daily as needed (allergies).     cholecalciferol (VITAMIN D3) 25 MCG (1000 UNIT) tablet Take 1,000 Units by mouth daily.     testosterone cypionate (DEPOTESTOSTERONE CYPIONATE) 200 MG/ML injection Inject 200 mg into the muscle every 14 (fourteen) days.     methocarbamol  (ROBAXIN ) 500 MG tablet Take 1 tablet (500 mg total) by mouth every 6 (six) hours as needed for muscle spasms. (Patient not taking: Reported on 04/20/2024) 40 tablet 1   oxyCODONE  (ROXICODONE ) 5 MG immediate release tablet Take 1-2 tablets (5-10 mg total) by mouth every 6 (six) hours as needed for severe pain. (Patient not taking: Reported on 04/20/2024) 10 tablet 0   phenazopyridine  (PYRIDIUM ) 200 MG tablet Take 1 tablet (200 mg total) by mouth 3 (three) times daily as needed  for pain. (Patient not taking: Reported on 04/20/2024) 12 tablet 0   tamsulosin  (FLOMAX ) 0.4 MG CAPS capsule Take 1 capsule (0.4 mg total) by mouth daily. (Patient not taking: Reported on 04/20/2024) 7 capsule 0    Physical Exam: Vitals:   05/02/24 0549  BP: (!) 130/94  Pulse: 68  Resp: 17  Temp: 98.9 F (37.2 C)  SpO2: 96%   Body mass index is 30.43 kg/m. Clinical exam: Willie Davis is a pleasant individual, who appears younger than their stated age.  He is alert and orientated 3.  No shortness of breath, chest pain.  Abdomen is soft and non-tender, negative loss of bowel and bladder control, no rebound tenderness.  Negative: skin lesions abrasions contusions  Peripheral pulses: 2+ peripheral pulses bilaterally in the lower extremity. LE compartments are: Soft and nontender.  Gait pattern: Normal  Assistive devices: None  Neuro: 4/5 motor strength in right EHL/tibialis anterior/gastrocnemius (L5 and S1 dermatome). 5/5 motor strength in the remainder of the lower extremity. Positive numbness and decreased sensation with dysesthesias primarily in the right L5 and S1 dermatome. Negative nerve root tension signs, negative Babinski test, no clonus. Symmetrical 2+ deep tendon reflexes in the lower extremity.  Musculoskeletal: Well-healed surgical scar from prior L5-S1 discectomy. Positive back pain with palpation and range of motion. No SI joint pain, negative FABER test. No hip, knee, ankle pain with isolated joint range of motion. No pain with hip, knee, ankle range of motion. No significant pain with direct palpation of  the SI joint, negative FABER test.  Imaging: X-rays of the lumbar spine demonstrate a grade 2 isthmic spondylolisthesis. Advanced degenerative disc disease L5-S1 with large lateral exostosis formation. No scoliosis. No significant arthritis of the SI joints or hip joints are seen.  Lumbar MRI: completed on 01/20/24. Severe bilateral facet arthrosis L4-5 with grade 2  spondylolisthesis. Positive foraminal stenosis that is moderate. Modic endplate changes L5-S1 with evidence of prior left hemilaminectomy. Mild facet arthrosis with advanced degenerative disc disease and loss in disc space height. Mild degenerative changes in the remainder of the lumbar spine. No fracture or abnormal marrow signal changes.  A/P:  Willie Davis is a very pleasant 58 year old gentleman who has had a prior L5-S1 decompression/discectomy and has had persistent back pain and radicular right leg pain. Over the course of the last several months he has developed dysesthesias and weakness in the right lower extremity and progressive loss in quality of life.  1. Patient has MRI imaging confirming moderate foraminal stenosis at L4-5 with a grade 2 spondylolisthesis and facet arthrosis. In addition the patient has significant degenerative disc disease at L5-S1 with evidence of prior left-sided decompression/discectomy. 2. Despite injection therapy and physical therapy as well as chiropractic manipulative therapy his pain has gotten progressively worse. Patient was recently discharged from formal physical therapy due to worsening pain and unlikely to improve with PT. He is also now demonstrating L5 and S1 radicular pain, motor and sensory deficits. Unfortunately the patient has developed neurological deficits that were not present at his original evaluation. This represents progression of the disease process and nerve irritation. The patient has no signs or symptoms of hip arthritis or SI joint dysfunction that could be contributing to his pain. Based on his clinical presentation, subjective complaints, and imaging findings there is no other possible source of pain other than the 2 level disc pathology (L4-5 and L5-S1). 3. The patient's overall pain has gotten progressively worse over the last 6 to 8 months despite recent physical therapy, injection therapy, and chiropractic treatment. 4. Patient reports  significant adverse quality of life due to his pain. He is unable to ambulate long distances, he has pain with static standing for greater than 10 minutes, and is unable to perform normal activities of daily living without reporting a flareup of pain that can last up to 24 to 48 hours.  Diagnosis: 1. Postlaminectomy syndrome L5-S1. Prior lumbar decompression with advanced degenerative disc disease and facet arthrosis. 2. 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  Despite clinical findings of two-level disc pathology the patient's insurance plan would not allow us  to address the L5-S1 pathology.  However due to the progressive loss and quality of life and radicular pain we elected to move forward with addressing the L4-5 isthmic spondylolisthesis.  Surgical plan would be a TLIF at L4-5.  We did discuss postoperative care as well as reasonable expectations for surgery.  The goal of surgery is to reduce his pain and improve his quality of life.  We did discuss how the L5-S1 pathology could continue to affect his quality of life and in the future could require surgical intervention.  Both he and his wife expressed an understanding of the risks, benefits, and alternatives to surgery as well as the expectations and goals.  All of their questions were addressed.   Risks and benefits of posterior spinal fusion: Infection, bleeding, death, stroke, paralysis, ongoing or worse pain, need for additional surgery, nonunion, leak of  spinal fluid, adjacent segment degeneration requiring additional fusion surgery, Injury to abdominal vessels that can require anterior surgery to stop bleeding. Malposition of the cage and/or pedicle screws that could require additional surgery. Loss of bowel and bladder control. Postoperative hematoma causing neurologic compression that could require urgent or emergent re-operation.

## 2024-05-02 NOTE — Anesthesia Procedure Notes (Signed)
 Procedure Name: Intubation Date/Time: 05/02/2024 7:44 AM  Performed by: Viviana Almarie DASEN, CRNAPre-anesthesia Checklist: Patient identified, Emergency Drugs available, Suction available and Patient being monitored Patient Re-evaluated:Patient Re-evaluated prior to induction Oxygen Delivery Method: Circle system utilized Preoxygenation: Pre-oxygenation with 100% oxygen Induction Type: IV induction Ventilation: Mask ventilation without difficulty Laryngoscope Size: Mac and 4 Grade View: Grade I Tube type: Oral Tube size: 7.5 mm Number of attempts: 1 Airway Equipment and Method: Stylet, Oral airway and Bite block Placement Confirmation: ETT inserted through vocal cords under direct vision, positive ETCO2 and breath sounds checked- equal and bilateral Secured at: 24 cm Tube secured with: Tape Dental Injury: Teeth and Oropharynx as per pre-operative assessment

## 2024-05-02 NOTE — Transfer of Care (Signed)
 Immediate Anesthesia Transfer of Care Note  Patient: Willie Davis  Procedure(s) Performed: TRANSFORAMINAL LUMBAR INTERBODY FUSION (TLIF) WITH PEDICLE SCREW FIXATION 1 LEVEL  Patient Location: PACU  Anesthesia Type:General  Level of Consciousness: sedated  Airway & Oxygen Therapy: Patient Spontanous Breathing, Patient connected to face mask oxygen, and nasal and oral airway  Post-op Assessment: Report given to RN and Post -op Vital signs reviewed and stable  Post vital signs: Reviewed and stable  Last Vitals:  Vitals Value Taken Time  BP 127/81 05/02/24 12:33  Temp    Pulse 76 05/02/24 12:40  Resp 13 05/02/24 12:40  SpO2 95 % 05/02/24 12:40  Vitals shown include unfiled device data.  Last Pain:  Vitals:   05/02/24 0643  TempSrc:   PainSc: 5       Patients Stated Pain Goal: 0 (05/02/24 9356)  Complications: No notable events documented.

## 2024-05-02 NOTE — Op Note (Signed)
 OPERATIVE REPORT  DATE OF SURGERY: 05/02/2024  PATIENT NAME:  Willie Davis MRN: [id number] DOB: 07/20/1966  PCP: Patient, No Pcp Per  PRE-OPERATIVE DIAGNOSIS: Grade 2 isthmic spondylolisthesis L4-5 with radiculopathy  POST-OPERATIVE DIAGNOSIS: Same  PROCEDURE:   TLIF L4-5 Gill decompression right L4-5 with complete L4 foraminotomy. Discectomy with placement of intervertebral cage. Posterior lateral arthrodesis left side with autograft bone Posterior instrumented fusion with cortical pedicle screws L4-5   SURGEON:  Donaciano Sprang, MD  PHYSICIAN ASSISTANT: Jeoffrey Sages, PA  ANESTHESIA:   General  EBL: 200 ml   Complications: None  Implants: NuVasive expandable intervertebral cage.  7 x 9 x 24.  Expanded to anterior height of 10 mm and posterior height of 9 mm with 17 degrees of lordosis.  Posterior cortical pedicle screw fixation L4-5: NuVasive cortical pedicle screws 6.5 x 45 mm length at L4 and 6.5 x 50 mm length left L5, 6.5 x 45 mm length right L5.  45 mm rods.  Graft: Autograft from decompression mixed with DBX mix.  Monitoring: Neuromonitoring was performed throughout the case.  No adverse SSEP or frequent EMG activity was noted.  All 4 screws were directly stimulated and there was no evidence to suggest breach or neural compromise.  Left side: Positive activity at 29 mA at L4 and 27 mA at L5.  Right side: Positive activity at 38 mA at L4 and 27 mA at L5.  BRIEF HISTORY: [name] is a 58 y.o. male who presented to my office with complaints of significant back buttock and radicular leg pain.  Patient had a prior L5-S1 discectomy and imaging confirmed degenerative disc disease at L5-S1 along with an isthmic spondylolisthesis at L4-5 creating lateral recess and foraminal stenosis.  Attempted conservative care failed to improve or alleviate his back buttock and radicular right leg pain.  After moving discussions we elected to move forward with surgery.  All appropriate  risks, benefits, alternatives were discussed with the patient and consent was obtained.  PROCEDURE DETAILS: Patient was brought into the operating room and was properly positioned on the operating room table.  After induction with general anesthesia the patient was endotracheally intubated.  A timeout was taken to confirm all important data: including patient, procedure, and the level. Teds, SCD's were applied.   Foley was placed by the nurse, and the neuromonitoring representative placed all appropriate needles for SSEP and fibrin EMG monitoring.  Patient was turned prone onto the spine frame and all bony prominences well-padded.  The back was then prepped and draped in a standard fashion.  Using fluoroscopy I marked out my incision site spanning from the inferior aspect of the L4  pedicle to the inferior aspect of the L5 pedicle.  The incision was infiltrated with quarter percent Marcaine  and a midline incision was made.  Sharp dissection was carried out down to the deep fascia.  I incised the deep fascia and stripped the paraspinal muscles to expose the L4 and the superior portion of the L5 spinous process and lamina.  There was significant scar tissue from his prior L5-S1 discectomy but I managed to avoid most of it.  The spondylolisthesis and pars defect was consistent with what was seen on the preoperative MRI.  I confirmed that is at the appropriate level and move forward with the placement of the cortical pedicle screws.  Using an AP fluoroscopy image identify the inferior medial corner of the pedicle of L4.  I approach this area with the high-speed bur and then  placed my pedicle awl.  I then advanced the pedicle aiming towards the superior lateral corner of the pedicle.  As I traversed the pedicle I confirmed satisfactory positioning in the AP plane.  Is a near the lateral third of the pedicle I switched to the lateral view to confirm that I was just beyond the posterior wall of the vertebral body.   Once I confirm satisfactory positioning and trajectory advanced into the vertebral body.  I removed the awl and then palpated the hole with a ball-tipped feeler.  The 6.5 mm diameter tap was then placed and removed and I repalpated the hole with a ball-tipped feeler.  When I confirmed that the track was intact and there was no breach I then placed the appropriate sized cortical pedicle screw.  Using the exact same technique I placed my screw on the contralateral side and at the L5 level.  Once all 4 pedicle screws were properly positioned I then directly stimulated them to confirm that there was no free running EMG activity to suggest breach in neuro compromise.  In addition imaging studies demonstrate satisfactory trajectory and positioning of the pedicle screws.  Using a double-action Leksell rongeur to remove the superior portion of the spinous processes of L4.  Using a 3 and 4 mm Kerrison rongeur performed a hemilaminotomy on the right side of L4.  I then removed the inferior articulating process of L4.  I then used my Penfield 4 to dissect through the ligamentum flavum until is able to create a plane between the thecal sac and the ligamentum flavum.  I used my Kerrison rongeurs to resect the ligamentum flavum and worked my way into the lateral recess on the right side.  I developed a plane between the nerve root and medial aspect of the inferior articulating process.  Once I created this plane I was able to use my 3 mm Kerrison rongeur to resect the medial portion of the L5 superior facet and complete my lateral recess decompression.  I was now able to visualize the medial and superior portion of the L5 pedicle.  Identify the L5 nerve root and began decompressing it towards the L5 foramen.  Once the L5 for nerve root was completely decompressed I then remove the remaining fibrocartilaginous material from the pars of L4 to decompress the L4 nerve root in the foramen.  Once I was able to identify the L4 nerve  root I then began working my way medially until I had adequately decompressed.  I can now palpate the inferior aspect of the L4 pedicle.  The posterior annulus of the disc came into excellent visualization.  There were large epidural veins all of which were isolated coagulated with bipolar electrocautery.  Hand-held neural retractors were placed to retract the thecal sac and protect the neural elements.  An annulotomy was performed with a 15 blade scalpel and then I used a combination of pituitary rongeurs, sidecutting curettes, and endplate scrapers to remove all of the disc material.  I used my sidecutting curettes to make sure I was working all the way to the contralateral side.  I confirmed with fluoroscopy that my discectomy was all the way to the contralateral side of the disc space.  Once I had adequately removed all of the disc and cartilaginous endplate I irrigated the wound copiously with normal saline.  I then placed my trialing devices and noted that the size 12 implant provided the best overall fit with restoration of disc height.  I elected to  use the expandable 7 mm cage as this would best approached a 12 mm final height.  The cage was obtained and I irrigated the wound copiously normal saline.  I then had my hands held held retractor to protect the thecal sac and nerve root and placed the cage and advanced it into the disc space.  Trajectory was aiming towards the contralateral side.  Once the cage was countersunk into the disc space I expanded the anterior and posterior heights until it had an excellent overall fit.  On the AP view the cage was just beyond the midline.  Satisfied with the final position of the cage I backfilled it with DBX mix.  I then took autograft and filled it lateral to the cage in the intervertebral space to aid in the overall fusion.  With the intervertebral cage properly positioned I then turned to placing the pedicle screw rod construct.  The left posterior lateral  gutter and L4-5 facet were decorticated with a high-speed bur.  Bone graft was placed in the posterior lateral gutter and facet complex.  The polyaxial heads were then snapped onto the screws as well as the rod secured with the locking caps.  Both locking caps were final tightened according manufacture standards.  The polyaxial heads were then placed on the right side and the locking cap was used to secure the rod in place.  Both of these were locking caps were also final tightened according manufactures teams.  All retractors now removed from the wound.  Final imaging was taken.  We confirmed satisfactory positioning of the pedicle screws and the intervertebral cage.  I then palpated the decompression and confirmed that the lateral recess was adequately decompressed as was the L4 nerve root with a complete foraminotomy and the L5 nerve root.  After final irrigation I placed a thrombin -soaked Gelfoam patty over the exposed thecal sac once I confirmed hemostasis.  I then removed the retractors and the deep fascia was closed with interrupted #1 Vicryl sutures.  Superficial was closed with a running 0 Vicryl followed by interrupted 2-0 Vicryl sutures.  3-0 Monocryl was used to close the skin edges.  Steri-Strips dry dressings were applied.  Patient was ultimately extubated and transferred to PACU then incident.  End of the case all needle sponge counts were correct.  There were no adverse intraoperative events.  Donaciano Sprang, MD 05/02/2024 11:51 AM

## 2024-05-02 NOTE — Anesthesia Postprocedure Evaluation (Signed)
 Anesthesia Post Note  Patient: Willie Davis  Procedure(s) Performed: TRANSFORAMINAL LUMBAR INTERBODY FUSION (TLIF) WITH PEDICLE SCREW FIXATION 1 LEVEL     Patient location during evaluation: PACU Anesthesia Type: General Level of consciousness: awake and alert Pain management: pain level controlled Vital Signs Assessment: post-procedure vital signs reviewed and stable Respiratory status: spontaneous breathing, nonlabored ventilation, respiratory function stable and patient connected to nasal cannula oxygen Cardiovascular status: blood pressure returned to baseline and stable Postop Assessment: no apparent nausea or vomiting Anesthetic complications: no   No notable events documented.  Last Vitals:  Vitals:   05/02/24 1515 05/02/24 1539  BP:  113/77  Pulse: 62 60  Resp: 14 18  Temp: 36.9 C 36.4 C  SpO2: 95% 94%    Last Pain:  Vitals:   05/02/24 1539  TempSrc: Oral  PainSc:    Pain Goal: Patients Stated Pain Goal: 0 (05/02/24 0643)                 [name]

## 2024-05-02 NOTE — Brief Op Note (Signed)
 05/02/2024  12:20 PM  PATIENT:  Willie Davis  58 y.o. 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  PROCEDURE:  Procedure(s) with comments: TRANSFORAMINAL LUMBAR INTERBODY FUSION (TLIF) WITH PEDICLE SCREW FIXATION 1 LEVEL (N/A) - Transforaminal lumbar interbody fusion L4-5  SURGEON:  Surgeons and Role:    DEWAINE Burnetta Aures, MD - Primary  PHYSICIAN ASSISTANT: Jeoffrey Sages, PA  ASSISTANTS:    ANESTHESIA:   general  EBL:  200 mL   BLOOD ADMINISTERED:none  DRAINS: none   LOCAL MEDICATIONS USED:  MARCAINE      SPECIMEN:  No Specimen  DISPOSITION OF SPECIMEN:  N/A  COUNTS:  YES  TOURNIQUET:  * No tourniquets in log *  DICTATION: .Dragon Dictation  PLAN OF CARE: Admit to inpatient   PATIENT DISPOSITION:  PACU - hemodynamically stable.

## 2024-05-03 ENCOUNTER — Encounter (HOSPITAL_COMMUNITY): Payer: Self-pay | Admitting: Orthopedic Surgery

## 2024-05-03 MED ORDER — MUPIROCIN 2 % EX OINT
TOPICAL_OINTMENT | Freq: Two times a day (BID) | CUTANEOUS | Status: DC
  Filled 2024-05-03: qty 22

## 2024-05-03 MED ORDER — METHOCARBAMOL 500 MG PO TABS: 500.0000 mg | ORAL_TABLET | Freq: Three times a day (TID) | ORAL | 0 refills | Status: AC | PRN

## 2024-05-03 MED ORDER — OXYCODONE-ACETAMINOPHEN 10-325 MG PO TABS: 1.0000 | ORAL_TABLET | Freq: Four times a day (QID) | ORAL | 0 refills | Status: AC | PRN

## 2024-05-03 MED ORDER — ONDANSETRON HCL 4 MG PO TABS: 4.0000 mg | ORAL_TABLET | Freq: Three times a day (TID) | ORAL | 0 refills | Status: AC | PRN

## 2024-05-03 NOTE — Progress Notes (Signed)
 Patient alert and oriented, void, ambulate. Surgical site clean and dry no sign of infection. D/c instructions explain and given, all questions answered.

## 2024-05-03 NOTE — Evaluation (Signed)
 Occupational Therapy Evaluation Patient Details Name: Willie Davis MRN: [id number] DOB: 01-Nov-1965 Today's Date: 05/03/2024   History of Present Illness   58 yo male s/p 7/28 TLIF L4-5  PMH kidney stones     Clinical Impressions Patient evaluated by Occupational Therapy with no further acute OT needs identified. All education has been completed and the patient has no further questions. See below for any follow-up Occupational Therapy or equipment needs. OT to sign off. Thank you for referral. '      If plan is discharge home, recommend the following:         Functional Status Assessment   Patient has had a recent decline in their functional status and demonstrates the ability to make significant improvements in function in a reasonable and predictable amount of time.     Equipment Recommendations   None recommended by OT     Recommendations for Other Services         Precautions/Restrictions   Precautions Precautions: Back Precaution Booklet Issued: Yes (comment) Recall of Precautions/Restrictions: Intact Required Braces or Orthoses: Spinal Brace Spinal Brace: Lumbar corset Restrictions Weight Bearing Restrictions Per Provider Order: No     Mobility Bed Mobility Overal bed mobility: Modified Independent                  Transfers Overall transfer level: Modified independent                        Balance                                           ADL either performed or assessed with clinical judgement   ADL Overall ADL's : Modified independent                                       General ADL Comments: dressing for home without AE, demonstrates stair transfer, shower step over transfer. able to recall back precautions   Back handout provided and reviewed adls in detail. Pt educated on: clothing between brace, never sleep in brace, set an alarm at night for medication, avoid sitting for long  periods of time, correct bed positioning for sleeping, correct sequence for bed mobility, avoiding lifting more than 5 pounds and never wash directly over incision. All education is complete and patient indicates understanding.   Vision Patient Visual Report: No change from baseline Vision Assessment?: No apparent visual deficits     Perception         Praxis         Pertinent Vitals/Pain Pain Assessment Pain Assessment: Faces Faces Pain Scale: Hurts a little bit Pain Location: back Pain Descriptors / Indicators: Sore Pain Intervention(s): Limited activity within patient's tolerance, Monitored during session, Premedicated before session, Repositioned     Extremity/Trunk Assessment Upper Extremity Assessment Upper Extremity Assessment: Overall WFL for tasks assessed   Lower Extremity Assessment Lower Extremity Assessment: Overall WFL for tasks assessed   Cervical / Trunk Assessment Cervical / Trunk Assessment: Back Surgery   Communication Communication Communication: No apparent difficulties   Cognition Arousal: Alert Behavior During Therapy: WFL for tasks assessed/performed Cognition: No apparent impairments  Following commands: Intact       Cueing  General Comments      incision dry and dressing intact   Exercises     Shoulder Instructions      Home Living Family/patient expects to be discharged to:: Private residence Living Arrangements: Spouse/significant other Available Help at Discharge: Family Type of Home: House Home Access: Stairs to enter Secretary/administrator of Steps: 1 Entrance Stairs-Rails: None Home Layout: Two level;1/2 bath on main level;Bed/bath upstairs Alternate Level Stairs-Number of Steps: 15-20 Alternate Level Stairs-Rails: Left Bathroom Shower/Tub: Producer, television/film/video: Handicapped height     Home Equipment: Crutches;Adaptive equipment;Shower seat - built in [employer]: Reacher Additional Comments: wife can (A) has 2 large german shepards      Prior Functioning/Environment Prior Level of Function : Independent/Modified Independent;Driving             Mobility Comments: enjoys golf      OT Problem List:     OT Treatment/Interventions:        OT Goals(Current goals can be found in the care plan section)   Acute Rehab OT Goals Patient Stated Goal: to return to golf Potential to Achieve Goals: Good   OT Frequency:       Co-evaluation              AM-PAC OT 6 Clicks Daily Activity     Outcome Measure Help from another person eating meals?: None Help from another person taking care of personal grooming?: None Help from another person toileting, which includes using toliet, bedpan, or urinal?: None Help from another person bathing (including washing, rinsing, drying)?: None Help from another person to put on and taking off regular upper body clothing?: None Help from another person to put on and taking off regular lower body clothing?: None 6 Click Score: 24   End of Session Equipment Utilized During Treatment: Back brace Nurse Communication: Mobility status  Activity Tolerance: Patient tolerated treatment well Patient left: in chair;with call bell/phone within reach  OT Visit Diagnosis: Unsteadiness on feet (R26.81)                Time: 9257-9193 OT Time Calculation (min): 24 min Charges:  OT General Charges $OT Visit: 1 Visit OT Evaluation $OT Eval Moderate Complexity: 1 Mod OT Treatments $Self Care/Home Management : 8-22 mins   Willie Davis, OTR/L  Acute Rehabilitation Services Office: [phone] .   Willie Davis 05/03/2024, 10:06 AM

## 2024-05-03 NOTE — Discharge Summary (Signed)
 Patient ID: RANULFO KALL MRN: [id number] DOB/AGE: Feb 28, 1966 58 y.o.  Admit date: 05/02/2024 Discharge date: 05/03/2024  Admission Diagnoses:  Principal Problem:   S/P lumbar fusion   Discharge Diagnoses:  Principal Problem:   S/P lumbar fusion  status post Procedure(s): TRANSFORAMINAL LUMBAR INTERBODY FUSION (TLIF) WITH PEDICLE SCREW FIXATION 1 LEVEL  Past Medical History:  Diagnosis Date   History of kidney stones    Hx MRSA infection 15 years ago   Pneumonia    age 35   PONV (postoperative nausea and vomiting)    during nasal surgery    Surgeries: Procedure(s): TRANSFORAMINAL LUMBAR INTERBODY FUSION (TLIF) WITH PEDICLE SCREW FIXATION 1 LEVEL on 05/02/2024   Consultants:   Discharged Condition: Improved  Hospital Course: BRIXTON FRANKO is an 58 y.o. male who was admitted 05/02/2024 for operative treatment of S/P lumbar fusion. Patient failed conservative treatments (please see the history and physical for the specifics) and had severe unremitting pain that affects sleep, daily activities and work/hobbies. After pre-op clearance, the patient was taken to the operating room on 05/02/2024 and underwent  Procedure(s): TRANSFORAMINAL LUMBAR INTERBODY FUSION (TLIF) WITH PEDICLE SCREW FIXATION 1 LEVEL.    Patient was given perioperative antibiotics:  Anti-infectives (From admission, onward)    Start     Dose/Rate Route Frequency Ordered Stop   05/02/24 2000  ceFAZolin  (ANCEF ) IVPB 1 g/50 mL premix        1 g 100 mL/hr over 30 Minutes Intravenous Every 8 hours 05/02/24 1536 05/03/24 0436   05/02/24 0604  ceFAZolin  (ANCEF ) IVPB 2g/100 mL premix        2 g 200 mL/hr over 30 Minutes Intravenous 30 min pre-op 05/02/24 0604 05/02/24 1148        Patient was given sequential compression devices and early ambulation to prevent DVT.   Patient benefited maximally from hospital stay and there were no complications. At the time of discharge, the patient was urinating/moving their  bowels without difficulty, tolerating a regular diet, pain is controlled with oral pain medications and they have been cleared by PT/OT.   Recent vital signs: Patient Vitals for the past 24 hrs:  BP Temp Temp src Pulse Resp SpO2  05/03/24 0355 120/73 99.1 F (37.3 C) Oral 60 20 95 %  05/02/24 2302 112/72 98.4 F (36.9 C) Oral (!) 58 18 100 %  05/02/24 2004 111/73 98.4 F (36.9 C) Oral 65 18 97 %  05/02/24 1539 113/77 97.6 F (36.4 C) Oral 60 18 94 %  05/02/24 1515 -- 98.5 F (36.9 C) -- 62 14 95 %  05/02/24 1445 121/86 -- -- 63 11 93 %  05/02/24 1430 120/81 -- -- 63 12 93 %  05/02/24 1415 122/83 -- -- 65 14 91 %  05/02/24 1400 122/80 -- -- 69 (!) 5 94 %  05/02/24 1345 116/82 -- -- 74 14 94 %  05/02/24 1330 126/86 -- -- 70 17 98 %  05/02/24 1315 128/81 -- -- 68 11 97 %  05/02/24 1300 (!) 129/93 -- -- 74 11 96 %  05/02/24 1245 128/86 -- -- 70 14 96 %  05/02/24 1233 127/81 98.5 F (36.9 C) -- 77 14 92 %     Recent laboratory studies: No results for input(s): WBC, HGB, HCT, PLT, NA, K, CL, CO2, BUN, CREATININE, GLUCOSE, INR, CALCIUM in the last 72 hours.  Invalid input(s): PT, 2   Discharge Medications:   Allergies as of 05/03/2024       Reactions  Allegra-d [fexofenadine-pseudoephed Er] Hives   Hydromorphone Itching   Face and eyes itched   Ivp Dye [iodinated Contrast Media] Hives        Medication List     STOP taking these medications    Azelastine-Fluticasone 137-50 MCG/ACT Susp   cholecalciferol 25 MCG (1000 UNIT) tablet Commonly known as: VITAMIN D3   oxyCODONE  5 MG immediate release tablet Commonly known as: Roxicodone    phenazopyridine  200 MG tablet Commonly known as: Pyridium    tamsulosin  0.4 MG Caps capsule Commonly known as: Flomax    testosterone cypionate 200 MG/ML injection Commonly known as: DEPOTESTOSTERONE CYPIONATE       TAKE these medications    methocarbamol  500 MG tablet Commonly known as:  ROBAXIN  Take 1 tablet (500 mg total) by mouth every 8 (eight) hours as needed for up to 5 days for muscle spasms. What changed: when to take this   ondansetron  4 MG tablet Commonly known as: Zofran  Take 1 tablet (4 mg total) by mouth every 8 (eight) hours as needed for nausea or vomiting.   oxyCODONE -acetaminophen  10-325 MG tablet Commonly known as: Percocet Take 1 tablet by mouth every 6 (six) hours as needed for up to 5 days for pain.        Diagnostic Studies: DG Lumbar Spine 2-3 Views Result Date: 05/02/2024 CLINICAL DATA:  Elective surgery EXAM: LUMBAR SPINE - 2-3 VIEW COMPARISON:  Lumbar spine x-ray 08/30/2013. FINDINGS: Two intraoperative fluoroscopic views of the lumbar spine. L4-L5 posterior fusion hardware and disc spacer were placed. There is mild anterolisthesis at this level. Fluoroscopy time 2 minutes 58 seconds. Fluoroscopy dose 137 micro gray. IMPRESSION: Intraoperative fluoroscopic views of the lumbar spine. Electronically Signed   By: Greig Pique M.D.   On: 05/02/2024 15:36   DG C-Arm 1-60 Min-No Report Result Date: 05/02/2024 Fluoroscopy was utilized by the requesting physician.  No radiographic interpretation.   DG C-Arm 1-60 Min-No Report Result Date: 05/02/2024 Fluoroscopy was utilized by the requesting physician.  No radiographic interpretation.   DG C-Arm 1-60 Min-No Report Result Date: 05/02/2024 Fluoroscopy was utilized by the requesting physician.  No radiographic interpretation.   DG C-Arm 1-60 Min-No Report Result Date: 05/02/2024 Fluoroscopy was utilized by the requesting physician.  No radiographic interpretation.       Follow-up Information     Burnetta Aures, MD. Schedule an appointment as soon as possible for a visit in 2 week(s).   Specialty: Orthopedic Surgery Why: If symptoms worsen, For suture removal, For wound re-check Contact information: [address] [place] [address] [phone]                  Discharge Plan:  discharge to home today pending PT clearance   Disposition:  Nickholas is a very pleasant 58 year old male who is POD1 from TLIF L4-L5. Surgical intervention was successful and without complications. His hospital course has been uncomplicated. He states that his pre-operative leg pain is much improved since surgery. He is ambulating on his own. He is tolerating oral intake well. He is compliant with the LSO brace. He is complaint with the incentive spirometer. He reports that his pain is well controled with oral pain medications. Dressing is c/d/I. Positive void, positive flatus, negative BM.   Patient to be d/c to home today pending PT clearance. Patient will follow up with us  in the office in 2 weeks. Patient understands that he cannot shower for the first 5 days. All questions were welcomed and answered  Signed: Jeoffrey LOISE Sages for Dr. Donaciano Sprang [facility] [phone] 05/03/2024, 7:37 AM

## 2024-05-03 NOTE — Progress Notes (Signed)
 PT Cancellation and Discharge Note  Patient Details Name: Willie Davis MRN: [id number] DOB: 1965-12-17   Cancelled Treatment:    Reason Eval/Treat Not Completed: Discussed case with OT, who did not find any PT needs; PT screened, no needs identified, will sign off  Silvano Currier, PT  Acute Rehabilitation Services Office [phone] Secure Chat welcomed    Silvano VEAR Currier 05/03/2024, 9:30 AM

## 2024-05-03 NOTE — Progress Notes (Signed)
 Patient is passing gas but hasn't had a BM yet.
